# Patient Record
Sex: Female | Born: 1935 | Race: White | Hispanic: No | Smoking: Former smoker
Health system: Southern US, Community
[De-identification: ages and names within clinical notes are randomized; demographics above are authoritative.]

## PROBLEM LIST (undated history)

## (undated) DIAGNOSIS — I619 Nontraumatic intracerebral hemorrhage, unspecified: Secondary | ICD-10-CM

## (undated) DIAGNOSIS — F0390 Unspecified dementia without behavioral disturbance: Secondary | ICD-10-CM

## (undated) DIAGNOSIS — F0281 Dementia in other diseases classified elsewhere with behavioral disturbance: Secondary | ICD-10-CM

## (undated) DIAGNOSIS — I6330 Cerebral infarction due to thrombosis of unspecified cerebral artery: Secondary | ICD-10-CM

## (undated) DIAGNOSIS — I872 Venous insufficiency (chronic) (peripheral): Secondary | ICD-10-CM

## (undated) DIAGNOSIS — E785 Hyperlipidemia, unspecified: Secondary | ICD-10-CM

## (undated) DIAGNOSIS — F02818 Dementia in other diseases classified elsewhere, unspecified severity, with other behavioral disturbance: Secondary | ICD-10-CM

## (undated) DIAGNOSIS — I6529 Occlusion and stenosis of unspecified carotid artery: Secondary | ICD-10-CM

## (undated) DIAGNOSIS — K219 Gastro-esophageal reflux disease without esophagitis: Secondary | ICD-10-CM

## (undated) DIAGNOSIS — I5022 Chronic systolic (congestive) heart failure: Secondary | ICD-10-CM

## (undated) DIAGNOSIS — I10 Essential (primary) hypertension: Secondary | ICD-10-CM

## (undated) DIAGNOSIS — E039 Hypothyroidism, unspecified: Secondary | ICD-10-CM

## (undated) DIAGNOSIS — M4800 Spinal stenosis, site unspecified: Secondary | ICD-10-CM

## (undated) DIAGNOSIS — I739 Peripheral vascular disease, unspecified: Secondary | ICD-10-CM

## (undated) DIAGNOSIS — N182 Chronic kidney disease, stage 2 (mild): Secondary | ICD-10-CM

## (undated) DIAGNOSIS — I2510 Atherosclerotic heart disease of native coronary artery without angina pectoris: Secondary | ICD-10-CM

## (undated) DIAGNOSIS — C50419 Malignant neoplasm of upper-outer quadrant of unspecified female breast: Secondary | ICD-10-CM

## (undated) HISTORY — DX: Hyperlipidemia, unspecified: E78.5

## (undated) HISTORY — DX: Chronic kidney disease, stage 2 (mild): N18.2

## (undated) HISTORY — DX: Dementia in other diseases classified elsewhere, unspecified severity, with other behavioral disturbance: F02.818

## (undated) HISTORY — DX: Nontraumatic intracerebral hemorrhage, unspecified: I61.9

## (undated) HISTORY — DX: Peripheral vascular disease, unspecified: I73.9

## (undated) HISTORY — DX: Spinal stenosis, site unspecified: M48.00

## (undated) HISTORY — DX: Occlusion and stenosis of unspecified carotid artery: I65.29

## (undated) HISTORY — PX: ABDOMINAL HYSTERECTOMY: SHX81

## (undated) HISTORY — DX: Gastro-esophageal reflux disease without esophagitis: K21.9

## (undated) HISTORY — PX: EYE SURGERY: SHX253

## (undated) HISTORY — DX: Essential (primary) hypertension: I10

## (undated) HISTORY — DX: Cerebral infarction due to thrombosis of unspecified cerebral artery: I63.30

## (undated) HISTORY — PX: CHOLECYSTECTOMY: SHX55

## (undated) HISTORY — DX: Dementia in other diseases classified elsewhere with behavioral disturbance: F02.81

## (undated) HISTORY — DX: Hypothyroidism, unspecified: E03.9

## (undated) HISTORY — DX: Venous insufficiency (chronic) (peripheral): I87.2

## (undated) HISTORY — DX: Malignant neoplasm of upper-outer quadrant of unspecified female breast: C50.419

---

## 2001-11-23 HISTORY — PX: JOINT REPLACEMENT: SHX530

## 2004-03-10 ENCOUNTER — Other Ambulatory Visit: Payer: Self-pay

## 2004-08-23 ENCOUNTER — Ambulatory Visit: Payer: Self-pay | Admitting: Internal Medicine

## 2004-10-07 ENCOUNTER — Ambulatory Visit: Payer: Self-pay | Admitting: Internal Medicine

## 2004-10-23 ENCOUNTER — Ambulatory Visit: Payer: Self-pay | Admitting: Internal Medicine

## 2005-05-15 ENCOUNTER — Ambulatory Visit: Payer: Self-pay | Admitting: Gastroenterology

## 2005-12-31 ENCOUNTER — Ambulatory Visit: Payer: Self-pay | Admitting: General Practice

## 2006-02-16 ENCOUNTER — Ambulatory Visit: Payer: Self-pay | Admitting: General Practice

## 2006-02-21 HISTORY — PX: OTHER SURGICAL HISTORY: SHX169

## 2006-02-23 ENCOUNTER — Ambulatory Visit: Payer: Self-pay | Admitting: General Practice

## 2006-08-30 ENCOUNTER — Ambulatory Visit: Payer: Self-pay | Admitting: Ophthalmology

## 2006-09-06 ENCOUNTER — Ambulatory Visit: Payer: Self-pay | Admitting: Ophthalmology

## 2006-11-23 HISTORY — PX: BREAST SURGERY: SHX581

## 2007-02-03 ENCOUNTER — Ambulatory Visit: Payer: Self-pay | Admitting: Infectious Diseases

## 2007-02-15 ENCOUNTER — Ambulatory Visit: Payer: Self-pay | Admitting: Infectious Diseases

## 2007-03-08 ENCOUNTER — Ambulatory Visit: Payer: Self-pay | Admitting: General Surgery

## 2007-03-08 ENCOUNTER — Other Ambulatory Visit: Payer: Self-pay

## 2007-03-15 ENCOUNTER — Ambulatory Visit: Payer: Self-pay | Admitting: General Surgery

## 2007-03-25 ENCOUNTER — Ambulatory Visit: Payer: Self-pay | Admitting: Oncology

## 2007-04-24 ENCOUNTER — Ambulatory Visit: Payer: Self-pay | Admitting: Oncology

## 2007-05-02 ENCOUNTER — Ambulatory Visit: Payer: Self-pay | Admitting: Radiation Oncology

## 2007-05-24 ENCOUNTER — Ambulatory Visit: Payer: Self-pay | Admitting: Oncology

## 2007-05-24 ENCOUNTER — Ambulatory Visit: Payer: Self-pay | Admitting: Radiation Oncology

## 2007-06-24 ENCOUNTER — Ambulatory Visit: Payer: Self-pay | Admitting: Oncology

## 2007-08-26 ENCOUNTER — Ambulatory Visit: Payer: Self-pay | Admitting: General Surgery

## 2007-09-24 ENCOUNTER — Ambulatory Visit: Payer: Self-pay | Admitting: Oncology

## 2007-10-14 ENCOUNTER — Ambulatory Visit: Payer: Self-pay | Admitting: Oncology

## 2007-10-24 ENCOUNTER — Ambulatory Visit: Payer: Self-pay | Admitting: Oncology

## 2007-11-24 ENCOUNTER — Ambulatory Visit: Payer: Self-pay | Admitting: Radiation Oncology

## 2007-11-28 ENCOUNTER — Ambulatory Visit: Payer: Self-pay | Admitting: Internal Medicine

## 2007-11-28 ENCOUNTER — Other Ambulatory Visit: Payer: Self-pay

## 2007-12-14 ENCOUNTER — Ambulatory Visit: Payer: Self-pay | Admitting: Radiation Oncology

## 2007-12-25 ENCOUNTER — Ambulatory Visit: Payer: Self-pay | Admitting: Radiation Oncology

## 2008-01-04 ENCOUNTER — Inpatient Hospital Stay: Payer: Self-pay | Admitting: Internal Medicine

## 2008-03-14 ENCOUNTER — Ambulatory Visit: Payer: Self-pay | Admitting: General Surgery

## 2008-04-19 ENCOUNTER — Ambulatory Visit: Payer: Self-pay | Admitting: Internal Medicine

## 2008-04-20 ENCOUNTER — Ambulatory Visit: Payer: Self-pay | Admitting: Oncology

## 2008-04-23 ENCOUNTER — Ambulatory Visit: Payer: Self-pay | Admitting: Oncology

## 2008-04-23 DIAGNOSIS — M4800 Spinal stenosis, site unspecified: Secondary | ICD-10-CM

## 2008-04-23 HISTORY — DX: Spinal stenosis, site unspecified: M48.00

## 2008-04-30 ENCOUNTER — Ambulatory Visit: Payer: Self-pay | Admitting: Internal Medicine

## 2008-05-23 ENCOUNTER — Ambulatory Visit: Payer: Self-pay | Admitting: Oncology

## 2008-09-30 ENCOUNTER — Emergency Department: Payer: Self-pay | Admitting: Emergency Medicine

## 2008-10-23 ENCOUNTER — Ambulatory Visit: Payer: Self-pay | Admitting: Oncology

## 2008-10-25 ENCOUNTER — Ambulatory Visit: Payer: Self-pay | Admitting: Oncology

## 2008-11-20 ENCOUNTER — Emergency Department: Payer: Self-pay | Admitting: Emergency Medicine

## 2008-11-23 ENCOUNTER — Ambulatory Visit: Payer: Self-pay | Admitting: Oncology

## 2009-03-18 ENCOUNTER — Ambulatory Visit: Payer: Self-pay | Admitting: General Surgery

## 2009-04-03 ENCOUNTER — Encounter: Payer: Self-pay | Admitting: Internal Medicine

## 2009-04-05 ENCOUNTER — Ambulatory Visit: Payer: Self-pay | Admitting: Internal Medicine

## 2009-04-23 ENCOUNTER — Encounter: Payer: Self-pay | Admitting: Internal Medicine

## 2009-05-23 ENCOUNTER — Encounter: Payer: Self-pay | Admitting: Internal Medicine

## 2009-06-26 ENCOUNTER — Ambulatory Visit: Payer: Self-pay | Admitting: Internal Medicine

## 2009-07-03 ENCOUNTER — Ambulatory Visit: Payer: Self-pay | Admitting: Internal Medicine

## 2009-07-10 ENCOUNTER — Ambulatory Visit: Payer: Self-pay | Admitting: Internal Medicine

## 2009-10-23 ENCOUNTER — Ambulatory Visit: Payer: Self-pay | Admitting: Oncology

## 2009-11-14 ENCOUNTER — Ambulatory Visit: Payer: Self-pay | Admitting: Oncology

## 2009-11-23 ENCOUNTER — Ambulatory Visit: Payer: Self-pay | Admitting: Oncology

## 2010-03-19 ENCOUNTER — Ambulatory Visit: Payer: Self-pay | Admitting: Oncology

## 2010-11-20 ENCOUNTER — Ambulatory Visit: Payer: Self-pay | Admitting: Oncology

## 2010-12-01 ENCOUNTER — Ambulatory Visit: Payer: Medicare Other | Admitting: Oncology

## 2010-12-24 ENCOUNTER — Ambulatory Visit: Payer: Medicare Other | Admitting: Oncology

## 2011-04-07 ENCOUNTER — Ambulatory Visit: Payer: Medicare Other | Admitting: Oncology

## 2011-06-03 ENCOUNTER — Ambulatory Visit: Payer: Medicare Other | Admitting: Oncology

## 2011-06-24 ENCOUNTER — Ambulatory Visit: Payer: Medicare Other | Admitting: Oncology

## 2011-07-24 ENCOUNTER — Telehealth: Payer: Self-pay | Admitting: Internal Medicine

## 2011-07-24 NOTE — Telephone Encounter (Signed)
Please send refill of Lipitor to [facility] [address]

## 2011-07-28 ENCOUNTER — Ambulatory Visit (INDEPENDENT_AMBULATORY_CARE_PROVIDER_SITE_OTHER): Payer: Medicare Other | Admitting: Internal Medicine

## 2011-07-28 ENCOUNTER — Encounter: Payer: Self-pay | Admitting: Internal Medicine

## 2011-07-28 VITALS — BP 173/79 | HR 76 | Temp 98.5°F | Resp 18 | Wt 221.8 lb

## 2011-07-28 DIAGNOSIS — I10 Essential (primary) hypertension: Secondary | ICD-10-CM | POA: Insufficient documentation

## 2011-07-28 DIAGNOSIS — E1051 Type 1 diabetes mellitus with diabetic peripheral angiopathy without gangrene: Secondary | ICD-10-CM

## 2011-07-28 DIAGNOSIS — Z853 Personal history of malignant neoplasm of breast: Secondary | ICD-10-CM

## 2011-07-28 DIAGNOSIS — I87329 Chronic venous hypertension (idiopathic) with inflammation of unspecified lower extremity: Secondary | ICD-10-CM

## 2011-07-28 DIAGNOSIS — M48061 Spinal stenosis, lumbar region without neurogenic claudication: Secondary | ICD-10-CM | POA: Insufficient documentation

## 2011-07-28 DIAGNOSIS — E785 Hyperlipidemia, unspecified: Secondary | ICD-10-CM

## 2011-07-28 DIAGNOSIS — N183 Chronic kidney disease, stage 3 unspecified: Secondary | ICD-10-CM

## 2011-07-28 DIAGNOSIS — M545 Low back pain, unspecified: Secondary | ICD-10-CM

## 2011-07-28 DIAGNOSIS — E1151 Type 2 diabetes mellitus with diabetic peripheral angiopathy without gangrene: Secondary | ICD-10-CM

## 2011-07-28 DIAGNOSIS — Z85828 Personal history of other malignant neoplasm of skin: Secondary | ICD-10-CM

## 2011-07-28 DIAGNOSIS — L989 Disorder of the skin and subcutaneous tissue, unspecified: Secondary | ICD-10-CM

## 2011-07-28 DIAGNOSIS — E039 Hypothyroidism, unspecified: Secondary | ICD-10-CM | POA: Insufficient documentation

## 2011-07-28 DIAGNOSIS — F0150 Vascular dementia without behavioral disturbance: Secondary | ICD-10-CM

## 2011-07-28 DIAGNOSIS — E1351 Other specified diabetes mellitus with diabetic peripheral angiopathy without gangrene: Secondary | ICD-10-CM | POA: Insufficient documentation

## 2011-07-28 DIAGNOSIS — E118 Type 2 diabetes mellitus with unspecified complications: Secondary | ICD-10-CM

## 2011-07-28 MED ORDER — ATORVASTATIN CALCIUM 40 MG PO TABS: 40.0000 mg | ORAL_TABLET | Freq: Every day | ORAL | Status: DC

## 2011-07-28 NOTE — Assessment & Plan Note (Signed)
Last Aic was 7.4 Apr 23 2011.  Currently her sugars are controlled. She is up to date on eye exams, is supposed to be taking losartan  Repeat A1c is due

## 2011-07-28 NOTE — Progress Notes (Signed)
  Subjective:    Patient ID: Christine Gonzales, female    DOB: 12/18/1924, 77 y.o.   MRN: [id number]  HPI  Christine Gonzales is an 77 yo white female with DM Type 2 complicated by CKD, CAD, and PAD with vascular dementia who is here for 3 month followup.  Chief complaints today are several:  Back pain, skin lesions, legs swollen.   Her low back pain is chronic, no acute worsening, no radiation to either leg. Aggravataed by days activities.  Takes tylenol for it. Does not want meds or workup at this time.  Daughter requesting dermatologic evaluation of several skin lesions on her back and right posterior calf.  All or pruritic .  Blood sugars under control according to log brought in.  One low of 65 fasting, occurred after a night of decreased dinner intake.  Blood pressures are elevatedn (4/5) with pulse in 60's.  Did not being current medication list with her,.  Changes were made at last visit May 31 with amlodipine stopped and spironolactonestarted.  Sprionolactone and  and losartan both missing from current recall. .   Past Medical History  Diagnosis Date  . Hypertension   . Malignant neoplasm of upper-outer quadrant of female breast   . Esophageal reflux   . Peripheral vascular disease, unspecified   . Unspecified venous (peripheral) insufficiency   . Dementia in conditions classified elsewhere with behavioral disturbance   . Chronic kidney disease, stage II (mild)   . Hyperlipidemia   . Hypothyroidism   . Diabetes mellitus     Type 2  . Spinal stenosis June 2009    Lumbar, by MRI     Review of Systems  Constitutional: Negative.   HENT: Negative.   Eyes: Negative.   Respiratory: Negative.   Cardiovascular: Positive for leg swelling. Negative for chest pain.  Gastrointestinal: Negative.   Genitourinary: Negative.   Musculoskeletal: Positive for back pain.  Skin: Positive for rash.  Neurological: Negative.   Hematological: Negative.   Psychiatric/Behavioral: Positive for confusion.    BP  173/79  Pulse 76  Temp(Src) 98.5 F (36.9 C) (Oral)  Resp 18  Wt 221 lb 12 oz (100.585 kg)  SpO2 96%    Objective:   Physical Exam  Constitutional: She appears well-developed and well-nourished.  HENT:  Mouth/Throat: Oropharynx is clear and moist.  Eyes: EOM are normal. Pupils are equal, round, and reactive to light. No scleral icterus.  Neck: Normal range of motion. Neck supple. No JVD present. No thyromegaly present.  Cardiovascular: Normal rate, regular rhythm, normal heart sounds and intact distal pulses.   Pulmonary/Chest: Effort normal and breath sounds normal.  Abdominal: Soft. Bowel sounds are normal. She exhibits no mass. There is no tenderness.  Musculoskeletal: She exhibits edema.  Lymphadenopathy:    She has no cervical adenopathy.  Neurological: She is alert.  Skin: Skin is warm and dry. Rash noted. There is erythema.  Psychiatric: She has a normal mood and affect.          Assessment & Plan:

## 2011-07-28 NOTE — Telephone Encounter (Signed)
Rx has been called in  

## 2011-07-28 NOTE — Patient Instructions (Addendum)
Your blood pressure medication needs adjusting and you need fasting labs  in two weeks.  Your blood sugars are all in good range, but make sure you do not skip a meal.

## 2011-07-29 NOTE — Assessment & Plan Note (Signed)
With chronic lymphedema type changes to both LEs.  Past histroy of ulcers but one currently.  Family helps her don compression stockings as she cannot put them on herself.Marland Kitchen

## 2011-07-29 NOTE — Assessment & Plan Note (Signed)
Her TSH has not been checked in 6 months.  Reviewed propere admn of meds.  TSH ordered.

## 2011-07-29 NOTE — Assessment & Plan Note (Signed)
She has regular followup with Dr. 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 Last ABIs were earlier in the year, 0.6 worst .

## 2011-07-29 NOTE — Assessment & Plan Note (Signed)
She relies on family to manage her medications and does not recall what she is taking or when.

## 2011-07-29 NOTE — Assessment & Plan Note (Signed)
Her recent FL2 requested indicated that she was no longer taking Femara, which was started after her dx of BRCA in 2008 .  Will review with family .  Dr. Neale Burly is her oncologist.

## 2011-07-29 NOTE — Assessment & Plan Note (Signed)
She has low back pain and tailbone pain, nonradiating, which limits her ambulation to less than 100 feet.

## 2011-08-05 ENCOUNTER — Encounter: Payer: Self-pay | Admitting: Internal Medicine

## 2011-08-11 ENCOUNTER — Other Ambulatory Visit (INDEPENDENT_AMBULATORY_CARE_PROVIDER_SITE_OTHER): Payer: Medicare Other | Admitting: *Deleted

## 2011-08-11 DIAGNOSIS — E785 Hyperlipidemia, unspecified: Secondary | ICD-10-CM

## 2011-08-11 DIAGNOSIS — E039 Hypothyroidism, unspecified: Secondary | ICD-10-CM

## 2011-08-11 DIAGNOSIS — E118 Type 2 diabetes mellitus with unspecified complications: Secondary | ICD-10-CM

## 2011-08-11 LAB — COMPREHENSIVE METABOLIC PANEL
CO2: 29 mEq/L (ref 19–32)
Calcium: 9.4 mg/dL (ref 8.4–10.5)
Chloride: 108 mEq/L (ref 96–112)
Creatinine, Ser: 1.5 mg/dL — ABNORMAL HIGH (ref 0.4–1.2)
GFR: 35.11 mL/min — ABNORMAL LOW (ref >60.00)
Glucose, Bld: 126 mg/dL — ABNORMAL HIGH (ref 70–99)
Sodium: 145 mEq/L (ref 135–145)
Total Bilirubin: 0.5 mg/dL (ref 0.3–1.2)
Total Protein: 7.5 g/dL (ref 6.0–8.3)

## 2011-08-11 LAB — TSH: TSH: 1.5 u[IU]/mL (ref 0.35–5.50)

## 2011-08-11 LAB — CHOLESTEROL, TOTAL: Cholesterol: 144 mg/dL (ref 0–200)

## 2011-08-17 ENCOUNTER — Telehealth: Payer: Self-pay | Admitting: Internal Medicine

## 2011-08-17 NOTE — Telephone Encounter (Signed)
KIM SAID LAST TIME MS Rebstock WAS IN DR TULLO WANTED TO GET XRAY AND MS Piggott REFUSED DAUGHTER WANTS TO GET THIS SET UP NOW.  DAUGHTER WANTS TO GET REFILL ON RX OF VICODIN [facility] [address]

## 2011-08-18 ENCOUNTER — Other Ambulatory Visit: Payer: Self-pay | Admitting: Internal Medicine

## 2011-08-19 MED ORDER — HYDROCODONE-ACETAMINOPHEN 5-500 MG PO TABS: 1.0000 | ORAL_TABLET | Freq: Two times a day (BID) | ORAL | Status: DC | PRN

## 2011-08-26 ENCOUNTER — Other Ambulatory Visit: Payer: Self-pay | Admitting: Internal Medicine

## 2011-09-01 DIAGNOSIS — Z0279 Encounter for issue of other medical certificate: Secondary | ICD-10-CM

## 2011-09-17 ENCOUNTER — Other Ambulatory Visit: Payer: Self-pay | Admitting: Internal Medicine

## 2011-09-22 ENCOUNTER — Encounter: Payer: Medicare Other | Admitting: Nurse Practitioner

## 2011-09-22 ENCOUNTER — Encounter: Payer: Medicare Other | Admitting: Cardiothoracic Surgery

## 2011-09-23 ENCOUNTER — Other Ambulatory Visit: Payer: Self-pay | Admitting: Internal Medicine

## 2011-09-24 ENCOUNTER — Encounter: Payer: Medicare Other | Admitting: Nurse Practitioner

## 2011-09-30 ENCOUNTER — Ambulatory Visit (INDEPENDENT_AMBULATORY_CARE_PROVIDER_SITE_OTHER): Payer: Medicare Other | Admitting: Internal Medicine

## 2011-09-30 DIAGNOSIS — Z111 Encounter for screening for respiratory tuberculosis: Secondary | ICD-10-CM

## 2011-10-01 ENCOUNTER — Encounter: Payer: Medicare Other | Admitting: Cardiothoracic Surgery

## 2011-10-02 ENCOUNTER — Ambulatory Visit: Payer: Medicare Other | Admitting: *Deleted

## 2011-10-08 ENCOUNTER — Telehealth: Payer: Self-pay | Admitting: *Deleted

## 2011-10-08 NOTE — Telephone Encounter (Signed)
[facility] [place] called - Pt has had loose stools x 1 wk. Daughter informed them not to be worried b/c pt has loose stools w/stress. Pt has had large amounts of mucus in the stools today and decreased appetite. Temp 97.9, BP 108/54, P 53. Spoke w/dr Darrick Huntsman - Lab order faxed over for stool culture, magnesium level, cbc and cmet. Pt has PRN order for immodium which they will start now. I advised she increase intake of clear liquids and call with any change in symptoms. Lab will fax results as soon as avail.   Faxed order to [phone]

## 2011-10-22 ENCOUNTER — Telehealth: Payer: Self-pay | Admitting: *Deleted

## 2011-10-22 NOTE — Telephone Encounter (Signed)
Christine Gonzales labs came back and she has a new iron deficiency anemia. Her hgb is down  To 9.8  I would like to send the [facility] an order for several more labs  1) hemoccult her stools.   2) EPO level 3) retic count  And I would like to start her on iron sulfate 325 mg onen tablet twice daily with meals.  #60 3 refills.

## 2011-10-22 NOTE — Telephone Encounter (Signed)
[facility] called - Pt was "not acting right", and unable to fed herself. They checked her cbg - it was 532. Pt has no sliding scale orders. Faxed order per Dr Darrick Huntsman for novolog 100u/mL 20 units now, also limit sweets and increase fluids. They advised me that u/a was in process also.

## 2011-10-23 ENCOUNTER — Ambulatory Visit: Payer: Medicare Other | Admitting: Internal Medicine

## 2011-10-23 ENCOUNTER — Observation Stay: Payer: Medicare Other | Admitting: Internal Medicine

## 2011-10-23 DIAGNOSIS — D649 Anemia, unspecified: Secondary | ICD-10-CM

## 2011-10-23 DIAGNOSIS — F05 Delirium due to known physiological condition: Secondary | ICD-10-CM

## 2011-10-23 DIAGNOSIS — E15 Nondiabetic hypoglycemic coma: Secondary | ICD-10-CM

## 2011-10-23 DIAGNOSIS — N390 Urinary tract infection, site not specified: Secondary | ICD-10-CM

## 2011-10-23 NOTE — Telephone Encounter (Signed)
The order is ready to send to [facility].

## 2011-10-24 ENCOUNTER — Ambulatory Visit: Payer: Medicare Other | Admitting: Internal Medicine

## 2011-10-28 ENCOUNTER — Encounter: Payer: Self-pay | Admitting: Internal Medicine

## 2011-11-03 ENCOUNTER — Telehealth: Payer: Self-pay | Admitting: *Deleted

## 2011-11-03 NOTE — Telephone Encounter (Signed)
Thsi should be a simple task , but because of EPIC it is not.  Please send them a note to resume home health.

## 2011-11-03 NOTE — Telephone Encounter (Signed)
Patient was recently in the hospital, but is now back at home and [facility] needs a note to resume home health. This can be faxed to [facility].

## 2011-11-11 NOTE — Telephone Encounter (Signed)
Left message for verbal order. 

## 2011-11-13 ENCOUNTER — Encounter: Payer: Self-pay | Admitting: *Deleted

## 2011-11-13 ENCOUNTER — Encounter: Payer: Self-pay | Admitting: Internal Medicine

## 2011-11-18 ENCOUNTER — Encounter: Payer: Self-pay | Admitting: Internal Medicine

## 2011-11-23 ENCOUNTER — Telehealth: Payer: Self-pay | Admitting: *Deleted

## 2011-11-23 DIAGNOSIS — M25559 Pain in unspecified hip: Secondary | ICD-10-CM

## 2011-11-23 MED ORDER — HYDROCODONE-ACETAMINOPHEN 5-500 MG PO TABS: 1.0000 | ORAL_TABLET | Freq: Four times a day (QID) | ORAL | Status: DC | PRN

## 2011-11-23 NOTE — Telephone Encounter (Signed)
Send her to Er.  I cannot evaluate her without an x ray

## 2011-11-23 NOTE — Telephone Encounter (Signed)
The [facility] has faxed a note stating that pt is complaining of severe hip and leg pain, asking what to do.  Note is in your "to Sign" folder, on your desk.

## 2011-11-23 NOTE — Telephone Encounter (Signed)
Spoke with Christine Gonzales at [employer]  She said pt has had this pain for awhile.  She takes 2 tylenol 325 mg's daily and one or two vicodin 5/500 mg daily.  They can have someone come out to x-ray the patient or they are asking if you want to increase her meds.  If so, they will need a written order faxed to them.

## 2011-11-24 DIAGNOSIS — I619 Nontraumatic intracerebral hemorrhage, unspecified: Secondary | ICD-10-CM

## 2011-11-24 DIAGNOSIS — I6330 Cerebral infarction due to thrombosis of unspecified cerebral artery: Secondary | ICD-10-CM

## 2011-11-24 HISTORY — DX: Cerebral infarction due to thrombosis of unspecified cerebral artery: I63.30

## 2011-11-24 HISTORY — DX: Nontraumatic intracerebral hemorrhage, unspecified: I61.9

## 2011-11-25 NOTE — Telephone Encounter (Signed)
rs for increased vicodin dosing and e x rays of both hips was faxed to the [facility] late Monday evening.

## 2011-11-30 ENCOUNTER — Telehealth: Payer: Self-pay | Admitting: *Deleted

## 2011-11-30 DIAGNOSIS — F329 Major depressive disorder, single episode, unspecified: Secondary | ICD-10-CM

## 2011-11-30 NOTE — Telephone Encounter (Signed)
Daughter called, she says that she does not want her mom to go to the ER. She says that she feels that will be really traumatic for her and she says that her mom just talks crazy sometimes. She is asking if she can have a counselor set up to come out and talk to her mom.

## 2011-11-30 NOTE — Telephone Encounter (Signed)
See if [facility] has a home healrh psyh RN who can make a house call /  I am strongly recommednig that she see a psychiatrist,  Will refer to Dr Jeanie Sewer if family agrees to take her

## 2011-11-30 NOTE — Telephone Encounter (Signed)
Fax from the [facility] states that pt is very upset and crying, she told her daughter she has done everything she knows to do except suicide.  Daughter is concerned.  Resident is known to be confused.  Please advise, fax is in your red folder.

## 2011-11-30 NOTE — Telephone Encounter (Signed)
Order in [facility] for psychiatry referral

## 2011-11-30 NOTE — Telephone Encounter (Signed)
Pt daughter tracy woods called wanted Morrie Sheldon to call ASAP  [phone]

## 2011-11-30 NOTE — Telephone Encounter (Signed)
Per Dr. Darrick Huntsman, advised Misty Stanley at the [facility] that pt needs to go to ER for involuntary commitment for suicide ideation.  Order faxed to the [facility] as well.

## 2011-11-30 NOTE — Telephone Encounter (Signed)
Bonita Quin with the [facility] notified that we will do referral to psychiatrist, DR. Williford. Daughter has been notified. [facility] and they do not have any psych RN that can make house calls. Family is okay with her just seeing Dr. Jeanie Sewer. Also the [facility] will need a copy of the referral faxed to them for their records.

## 2011-11-30 NOTE — Telephone Encounter (Signed)
Please call French Ana back  [phone]

## 2011-11-30 NOTE — Telephone Encounter (Signed)
Order printed and faxed to the [facility]. Family will wait to hear from [facility] to get this set up.

## 2011-12-01 ENCOUNTER — Telehealth: Payer: Self-pay | Admitting: Internal Medicine

## 2011-12-01 NOTE — Telephone Encounter (Signed)
Advised pt's daughter.  She said pt was to see Dr. Latanya Maudlin this morning.  She asked that I cancel pt's appt with Dr. Jeanie Sewer, I called that office and left a message on voice mail to cancel appt.

## 2011-12-01 NOTE — Telephone Encounter (Signed)
I have set Christine Gonzales up with Dr. Jeanie Sewer, as it was documented in my referral wq.  When I called daughter to advise her of the appointment information she wanted to know what the difference was in Dr. York Spaniel who comes to the [facility], and Dr. Jeanie Sewer.  Daughter wants to make sure her mom gets the help she needs but also doesn't want her insurance to deny payment.  She said that if she needed to come from [place] and physically take her mom somewhere she would do that, but if Dr. York Spaniel who did an inital  assessment on her mother on 11/06/11 is the same specialist as Dr. Jeanie Sewer she would like for Dr. Latanya Maudlin to see her mother at the [facility].  Please advise.

## 2011-12-01 NOTE — Telephone Encounter (Signed)
No she can see Dr. York Spaniel,,  She is probably a psychologist,  not a psychiatrist.. I was not aware that the [facility] had someone there, but we can certainly start with her.

## 2011-12-02 ENCOUNTER — Telehealth: Payer: Self-pay | Admitting: *Deleted

## 2011-12-02 ENCOUNTER — Encounter: Payer: Self-pay | Admitting: Internal Medicine

## 2011-12-02 NOTE — Telephone Encounter (Signed)
Christine Gonzales at the [name] called to report that med tech has noticed a few spots of blood in pt's underwear and they are asking if you want to treat her with anything or see her in the office.

## 2011-12-02 NOTE — Telephone Encounter (Signed)
She will need to be scheduled for a pelvic ,

## 2011-12-03 NOTE — Telephone Encounter (Signed)
Left message on [facility] voice mail, this morning, advising that pt needs to be seen.

## 2011-12-07 NOTE — Telephone Encounter (Signed)
Pt has appt on 11/1611.

## 2011-12-09 ENCOUNTER — Ambulatory Visit (INDEPENDENT_AMBULATORY_CARE_PROVIDER_SITE_OTHER): Payer: Medicare Other | Admitting: Internal Medicine

## 2011-12-09 ENCOUNTER — Encounter: Payer: Self-pay | Admitting: Internal Medicine

## 2011-12-09 VITALS — BP 140/60 | HR 82 | Temp 98.3°F | Wt 201.0 lb

## 2011-12-09 DIAGNOSIS — I70209 Unspecified atherosclerosis of native arteries of extremities, unspecified extremity: Secondary | ICD-10-CM

## 2011-12-09 DIAGNOSIS — M25559 Pain in unspecified hip: Secondary | ICD-10-CM

## 2011-12-09 DIAGNOSIS — I798 Other disorders of arteries, arterioles and capillaries in diseases classified elsewhere: Secondary | ICD-10-CM

## 2011-12-09 DIAGNOSIS — J40 Bronchitis, not specified as acute or chronic: Secondary | ICD-10-CM

## 2011-12-09 DIAGNOSIS — E1351 Other specified diabetes mellitus with diabetic peripheral angiopathy without gangrene: Secondary | ICD-10-CM

## 2011-12-09 DIAGNOSIS — K625 Hemorrhage of anus and rectum: Secondary | ICD-10-CM

## 2011-12-09 DIAGNOSIS — E1151 Type 2 diabetes mellitus with diabetic peripheral angiopathy without gangrene: Secondary | ICD-10-CM

## 2011-12-09 DIAGNOSIS — F0150 Vascular dementia without behavioral disturbance: Secondary | ICD-10-CM

## 2011-12-09 MED ORDER — HYDROCODONE-ACETAMINOPHEN 5-500 MG PO TABS: 1.0000 | ORAL_TABLET | Freq: Four times a day (QID) | ORAL | Status: DC | PRN

## 2011-12-09 MED ORDER — DOXYCYCLINE HYCLATE 100 MG PO TABS: 100.0000 mg | ORAL_TABLET | Freq: Two times a day (BID) | ORAL | Status: AC

## 2011-12-09 MED ORDER — PREDNISONE (PAK) 10 MG PO TABS: ORAL_TABLET | ORAL | Status: AC

## 2011-12-09 NOTE — Progress Notes (Signed)
Subjective:    Patient ID: Christine Gonzales, female    DOB: 1924/12/01, 78 y.o.   MRN: [id number]  HPI  [name] is an 78 year old white female history of diabetes mellitus with chronic kidney disease neuropathy and peripheral vascular disease as well as vascular dementia and history of breast cancer who recently moved into the [facility] for long-term care. The appointment was made today because of reports from the [facility] of blood stained underwear. Patient states that she has seen occasional blood in her underwear and when she wipes but does not appear concerned about it she does not feel well today secondary to a cough which has been present for several days. She denies chest pain shortness of breath wheezing abdominal pain and vaginal bleeding she is not sure where the blood is coming from and is not report any trouble with constipation.  Past Medical History  Diagnosis Date  . Hypertension   . Malignant neoplasm of upper-outer quadrant of female breast   . Esophageal reflux   . Peripheral vascular disease, unspecified   . Unspecified venous (peripheral) insufficiency   . Dementia in conditions classified elsewhere with behavioral disturbance   . Chronic kidney disease, stage II (mild)   . Hyperlipidemia   . Hypothyroidism   . Diabetes mellitus     Type 2  . Spinal stenosis June 2009    Lumbar, by MRI   Current Outpatient Prescriptions on File Prior to Visit  Medication Sig Dispense Refill  . aspirin 81 MG tablet Take 81 mg by mouth daily.        Marland Kitchen atorvastatin (LIPITOR) 40 MG tablet Take 1 tablet (40 mg total) by mouth daily.  30 tablet  3  . cholecalciferol (VITAMIN D) 1000 UNITS tablet Take 1,000 Units by mouth daily.        Marland Kitchen donepezil (ARICEPT) 5 MG tablet Take 5 mg by mouth at bedtime.        . furosemide (LASIX) 20 MG tablet Take 20 mg by mouth daily.       Marland Kitchen letrozole (FEMARA) 2.5 MG tablet Take 2.5 mg by mouth daily.        Marland Kitchen levothyroxine (SYNTHROID, LEVOTHROID) 125 MCG  tablet TAKE ONE TABLET BY MOUTH DAILY  90 tablet  3  . memantine (NAMENDA) 10 MG tablet Take 10 mg by mouth daily.       . metoprolol (LOPRESSOR) 50 MG tablet TAKE ONE TABLET BY MOUTH EVERY 12 HOURS  180 tablet  3  . NOVOLOG MIX 70/30 (70-30) 100 UNIT/ML injection INJECT 20 UNITS BEFORE BREAKFAST AND 8 UNITS WITH EVENING MEALS  10 mL  PRN  . omeprazole (PRILOSEC) 20 MG capsule Take 20 mg by mouth daily.        Marland Kitchen RELION INSULIN SYRINGE 1ML/31G 31G X 5/16" 1 ML MISC USE AS DIRECTED TWICE DAILY  60 each  11  . amLODipine (NORVASC) 5 MG tablet Take 5 mg by mouth daily.        Marland Kitchen glyBURIDE (DIABETA) 5 MG tablet Take 5 mg by mouth 2 (two) times daily.          Review of Systems  Constitutional: Negative for fever, chills and unexpected weight change.  HENT: Negative for hearing loss, ear pain, nosebleeds, congestion, sore throat, facial swelling, rhinorrhea, sneezing, mouth sores, trouble swallowing, neck pain, neck stiffness, voice change, postnasal drip, sinus pressure, tinnitus and ear discharge.   Eyes: Negative for pain, discharge, redness and visual disturbance.  Respiratory: Positive for  cough. Negative for chest tightness, shortness of breath, wheezing and stridor.   Cardiovascular: Negative for chest pain, palpitations and leg swelling.  Genitourinary: Positive for vaginal bleeding.  Musculoskeletal: Negative for myalgias and arthralgias.  Skin: Negative for color change and rash.  Neurological: Negative for dizziness, weakness, light-headedness and headaches.  Hematological: Negative for adenopathy.       Objective:   Physical Exam  Constitutional: She is oriented to person, place, and time. She appears well-developed and well-nourished.  HENT:  Mouth/Throat: Oropharynx is clear and moist.  Eyes: EOM are normal. Pupils are equal, round, and reactive to light. No scleral icterus.  Neck: Normal range of motion. Neck supple. No JVD present. No thyromegaly present.  Cardiovascular:  Normal rate, regular rhythm, normal heart sounds and intact distal pulses.   Pulmonary/Chest: Effort normal and breath sounds normal.  Abdominal: Soft. Bowel sounds are normal. She exhibits no mass. There is no tenderness.  Genitourinary: Vagina normal and uterus normal. No vaginal discharge found.  Musculoskeletal: Normal range of motion. She exhibits no edema.  Lymphadenopathy:    She has no cervical adenopathy.  Neurological: She is alert and oriented to person, place, and time.  Skin: Skin is warm and dry.  Psychiatric: She has a normal mood and affect.      Assessment & Plan:   Peripheral vascular disease due to secondary diabetes mellitus She has combined atherosclerotic disease of the peripheral arterial circulation as well as venous insufficiency. Her vascular disease it is non-operable due to multiple comorbidities and we will continue to balance management of venous insufficiency with low strength compression stockings and when necessary use of Lasix.  Bright red blood per rectum Pelvic and rectal exam were performed today to rule out gynecologic etiology of the blood that has been noticed in her underwear. Her pelvic exam is completely normal except for an exoration on her left labium, which appears to have been scratched.  Will prescribe nystatin empirically. She does have some internal and external hemorrhoids I suspect this is the cause of her bleeding. We'll prescribe Anusol HC and Colace to manage any constipation it may be contributing to bleeding internal or external hemorrhoids. No further workup given her age and comorbidities.  Arteriosclerotic dementia She has recently been moved to assisted living at the [facility] after the death of her husband who was her primary caregiver assisted by 2 daughters. She is adjusting as well as can be expected however has made several comments suggesting she is mildly depressed. The [name] does have a psychiatric nurse practitioner who is scheduled  to evaluate patient and make suggestions regarding medical management.  Bronchitis Given her comorbidities of diabetes and dementia will treat her current bronchitis with doxycycline and cough suppressants. She is not wheezing on exam and therefore does not require steroids or bronchodilators.    Updated Medication List Outpatient Encounter Prescriptions as of 12/09/2011  Medication Sig Dispense Refill  . aspirin 81 MG tablet Take 81 mg by mouth daily.        Marland Kitchen atorvastatin (LIPITOR) 40 MG tablet Take 1 tablet (40 mg total) by mouth daily.  30 tablet  3  . cholecalciferol (VITAMIN D) 1000 UNITS tablet Take 1,000 Units by mouth daily.        Marland Kitchen donepezil (ARICEPT) 5 MG tablet Take 5 mg by mouth at bedtime.        . furosemide (LASIX) 20 MG tablet Take 20 mg by mouth daily.       Marland Kitchen glipiZIDE (  GLUCOTROL) 5 MG tablet Take 5 mg by mouth 2 (two) times daily before a meal.      . HYDROcodone-acetaminophen (VICODIN) 5-500 MG per tablet Take 1 tablet by mouth every 6 (six) hours as needed for pain (or for cough).  120 tablet  1  . letrozole (FEMARA) 2.5 MG tablet Take 2.5 mg by mouth daily.        Marland Kitchen levothyroxine (SYNTHROID, LEVOTHROID) 125 MCG tablet TAKE ONE TABLET BY MOUTH DAILY  90 tablet  3  . losartan (COZAAR) 100 MG tablet Take 100 mg by mouth daily.      . memantine (NAMENDA) 10 MG tablet Take 10 mg by mouth daily.       . metoprolol (LOPRESSOR) 50 MG tablet TAKE ONE TABLET BY MOUTH EVERY 12 HOURS  180 tablet  3  . NOVOLOG MIX 70/30 (70-30) 100 UNIT/ML injection INJECT 20 UNITS BEFORE BREAKFAST AND 8 UNITS WITH EVENING MEALS  10 mL  PRN  . omeprazole (PRILOSEC) 20 MG capsule Take 20 mg by mouth daily.        Marland Kitchen RELION INSULIN SYRINGE 1ML/31G 31G X 5/16" 1 ML MISC USE AS DIRECTED TWICE DAILY  60 each  11  . DISCONTD: HYDROcodone-acetaminophen (VICODIN) 5-500 MG per tablet Take 1 tablet by mouth every 6 (six) hours as needed for pain.  120 tablet  1  . amLODipine (NORVASC) 5 MG tablet Take 5 mg  by mouth daily.        Marland Kitchen doxycycline (VIBRA-TABS) 100 MG tablet Take 1 tablet (100 mg total) by mouth 2 (two) times daily.  14 tablet  0  . glyBURIDE (DIABETA) 5 MG tablet Take 5 mg by mouth 2 (two) times daily.        . predniSONE (STERAPRED UNI-PAK) 10 MG tablet 6 tablets on Day 1 , then reduce by 1 tablet daily until gone  21 tablet  0

## 2011-12-11 ENCOUNTER — Encounter: Payer: Self-pay | Admitting: Internal Medicine

## 2011-12-11 DIAGNOSIS — J40 Bronchitis, not specified as acute or chronic: Secondary | ICD-10-CM | POA: Insufficient documentation

## 2011-12-11 DIAGNOSIS — K625 Hemorrhage of anus and rectum: Secondary | ICD-10-CM | POA: Insufficient documentation

## 2011-12-11 NOTE — Assessment & Plan Note (Signed)
Pelvic and rectal exam were performed today to rule out gynecologic etiology of the blood that has been noticed in her underwear. Her pelvic exam is completely normal except for vaginal atrophy. She does have some internal and external hemorrhoids I suspect this is the cause of her bleeding. We'll prescribe Anusol HC and Colace to manage any constipation it may be contributing to bleeding internal or external hemorrhoids. No further workup given her age and comorbidities.

## 2011-12-11 NOTE — Assessment & Plan Note (Signed)
She has recently been moved to assisted living at the [facility] after the death of her husband who was her primary caregiver assisted by 2 daughters. She is adjusting as well as can be expected however has made several comments suggesting she is mildly depressed. The [name] does have a psychiatric nurse practitioner who is scheduled to evaluate patient and make suggestions regarding medical management.

## 2011-12-11 NOTE — Assessment & Plan Note (Addendum)
I have reviewed the CBGs that have been documented by the staff at the [facility]. Most of her blood sugars appear to be in range. No changes are made today to her insulin regimen.  She has chronic renal insufficiency which is stable and the hemoglobin A1c was 6.3 in mid November 2012.

## 2011-12-11 NOTE — Assessment & Plan Note (Signed)
She has combined atherosclerotic disease of the peripheral arterial circulation as well as venous insufficiency. Her vascular disease it is non-operable due to multiple comorbidities and we will continue to balance management of venous insufficiency with low strength compression stockings and when necessary use of Lasix.

## 2011-12-11 NOTE — Assessment & Plan Note (Signed)
Given her comorbidities of diabetes and dementia will treat her current bronchitis with doxycycline and cough suppressants. She is not wheezing on exam and therefore does not require steroids or bronchodilators.

## 2012-02-23 ENCOUNTER — Telehealth: Payer: Self-pay | Admitting: Internal Medicine

## 2012-02-23 ENCOUNTER — Inpatient Hospital Stay: Payer: Self-pay | Admitting: Internal Medicine

## 2012-02-23 LAB — COMPREHENSIVE METABOLIC PANEL
Albumin: 3.5 g/dL (ref 3.4–5.0)
BUN: 22 mg/dL — ABNORMAL HIGH (ref 7–18)
Calcium, Total: 8.6 mg/dL (ref 8.5–10.1)
Co2: 24 mmol/L (ref 21–32)
Creatinine: 1.44 mg/dL — ABNORMAL HIGH (ref 0.60–1.30)
EGFR (African American): 45 — ABNORMAL LOW
EGFR (Non-African Amer.): 37 — ABNORMAL LOW
Glucose: 134 mg/dL — ABNORMAL HIGH (ref 65–99)
Osmolality: 287 (ref 275–301)
Potassium: 3.9 mmol/L (ref 3.5–5.1)
SGPT (ALT): 22 U/L
Sodium: 141 mmol/L (ref 136–145)

## 2012-02-23 LAB — PRO B NATRIURETIC PEPTIDE: B-Type Natriuretic Peptide: 62931 pg/mL — ABNORMAL HIGH (ref 0–450)

## 2012-02-23 LAB — CBC
HCT: 29.3 % — ABNORMAL LOW (ref 35.0–47.0)
MCH: 20 pg — ABNORMAL LOW (ref 26.0–34.0)
RDW: 19.2 % — ABNORMAL HIGH (ref 11.5–14.5)
WBC: 11.3 10*3/uL — ABNORMAL HIGH (ref 3.6–11.0)

## 2012-02-23 LAB — URINALYSIS, COMPLETE
Glucose,UR: NEGATIVE mg/dL (ref 0–75)
Ketone: NEGATIVE
Nitrite: NEGATIVE
Ph: 5 (ref 4.5–8.0)
Protein: NEGATIVE
Specific Gravity: 1.008 (ref 1.003–1.030)
WBC UR: 6 /HPF (ref 0–5)

## 2012-02-23 LAB — TSH: Thyroid Stimulating Horm: 0.74 u[IU]/mL

## 2012-02-23 LAB — PROTIME-INR: Prothrombin Time: 17.2 secs — ABNORMAL HIGH (ref 11.5–14.7)

## 2012-02-23 LAB — CK TOTAL AND CKMB (NOT AT ARMC)
CK, Total: 74 U/L (ref 21–215)
CK-MB: 3.3 ng/mL (ref 0.5–3.6)

## 2012-02-23 LAB — HEMOGLOBIN A1C: Hemoglobin A1C: 5.8 % (ref 4.2–6.3)

## 2012-02-23 NOTE — Telephone Encounter (Signed)
[facility] Triage Call Report Triage Record Num: [id number] Operator: Caswell Corwin Patient Name: [facility] Call Date & Time: 02/23/2012 4:05:52PM Patient Phone: [phone] PCP: Patient Gender: Female PCP Fax : Patient DOB: 03-25-1925 Practice Name: [facility] Reason for Call: Caller: [facility] [employer]. /; PCP: Duncan Dull; CB#: [phone]; Call that pt has been having weakness, dizziness, irregular pulse and chest congestion and is being transported to [facility]. No Access to [facility]. Protocol(s) Used: PCP Calls, No Triage (Adult) Recommended Outcome per Protocol: Call Provider Immediately Reason for Outcome: Notification of hospital admission Care Advice: ~ 02/23/2012 4:12:13PM Page 1 of 1 [email or web address] [facility] Triage Call Report Triage Record Num: [id number] Operator: Caswell Corwin Patient Name: [facility] Call Date & Time: 02/23/2012 4:05:52PM Patient Phone: [phone] PCP: Patient Gender: Female PCP Fax : Patient DOB: 1925/07/07 Practice Name: [facility] Reason for Call: Caller: Jamie at the [facility] [place] [facility] [employer]. /; PCP: Duncan Dull; CB#: [phone]; Call that pt has been having weakness, dizziness, irregular pulse and chest congestion and is being transported to [facility]. No Access to [facility]. Protocol(s) Used: Office Note Recommended Outcome per Protocol: Information Noted and Sent to Office Reason for Outcome: Caller information to office Care Advice: ~ 02/23/2012 4:12:17PM Page 1 of 1 [email or web address]

## 2012-02-24 ENCOUNTER — Encounter (HOSPITAL_COMMUNITY): Payer: Self-pay | Admitting: Physician Assistant

## 2012-02-24 ENCOUNTER — Inpatient Hospital Stay (HOSPITAL_COMMUNITY)
Admission: AD | Admit: 2012-02-24 | Discharge: 2012-02-29 | DRG: 280 | Disposition: A | Discharge status: Nursing Home (Includes ICF) | Payer: Medicare Other | Source: Ambulatory Visit | Attending: Cardiology | Admitting: Cardiology

## 2012-02-24 ENCOUNTER — Inpatient Hospital Stay (HOSPITAL_COMMUNITY): Admission: EM | Admit: 2012-02-24 | Payer: Self-pay | Source: Other Acute Inpatient Hospital | Admitting: Cardiology

## 2012-02-24 DIAGNOSIS — R05 Cough: Secondary | ICD-10-CM

## 2012-02-24 DIAGNOSIS — Z87891 Personal history of nicotine dependence: Secondary | ICD-10-CM

## 2012-02-24 DIAGNOSIS — I672 Cerebral atherosclerosis: Secondary | ICD-10-CM | POA: Diagnosis present

## 2012-02-24 DIAGNOSIS — I498 Other specified cardiac arrhythmias: Secondary | ICD-10-CM | POA: Diagnosis not present

## 2012-02-24 DIAGNOSIS — I6529 Occlusion and stenosis of unspecified carotid artery: Secondary | ICD-10-CM | POA: Diagnosis present

## 2012-02-24 DIAGNOSIS — I214 Non-ST elevation (NSTEMI) myocardial infarction: Secondary | ICD-10-CM

## 2012-02-24 DIAGNOSIS — Z96659 Presence of unspecified artificial knee joint: Secondary | ICD-10-CM

## 2012-02-24 DIAGNOSIS — I2510 Atherosclerotic heart disease of native coronary artery without angina pectoris: Secondary | ICD-10-CM | POA: Diagnosis present

## 2012-02-24 DIAGNOSIS — Z9849 Cataract extraction status, unspecified eye: Secondary | ICD-10-CM

## 2012-02-24 DIAGNOSIS — E876 Hypokalemia: Secondary | ICD-10-CM

## 2012-02-24 DIAGNOSIS — I129 Hypertensive chronic kidney disease with stage 1 through stage 4 chronic kidney disease, or unspecified chronic kidney disease: Secondary | ICD-10-CM | POA: Diagnosis present

## 2012-02-24 DIAGNOSIS — Z66 Do not resuscitate: Secondary | ICD-10-CM | POA: Diagnosis present

## 2012-02-24 DIAGNOSIS — I739 Peripheral vascular disease, unspecified: Secondary | ICD-10-CM | POA: Diagnosis present

## 2012-02-24 DIAGNOSIS — I70209 Unspecified atherosclerosis of native arteries of extremities, unspecified extremity: Secondary | ICD-10-CM | POA: Diagnosis present

## 2012-02-24 DIAGNOSIS — I5021 Acute systolic (congestive) heart failure: Secondary | ICD-10-CM | POA: Diagnosis present

## 2012-02-24 DIAGNOSIS — Z794 Long term (current) use of insulin: Secondary | ICD-10-CM

## 2012-02-24 DIAGNOSIS — Z6841 Body Mass Index (BMI) 40.0 and over, adult: Secondary | ICD-10-CM

## 2012-02-24 DIAGNOSIS — E1151 Type 2 diabetes mellitus with diabetic peripheral angiopathy without gangrene: Secondary | ICD-10-CM

## 2012-02-24 DIAGNOSIS — R5381 Other malaise: Secondary | ICD-10-CM

## 2012-02-24 DIAGNOSIS — R748 Abnormal levels of other serum enzymes: Secondary | ICD-10-CM

## 2012-02-24 DIAGNOSIS — F0390 Unspecified dementia without behavioral disturbance: Secondary | ICD-10-CM | POA: Diagnosis present

## 2012-02-24 DIAGNOSIS — D649 Anemia, unspecified: Secondary | ICD-10-CM | POA: Diagnosis present

## 2012-02-24 DIAGNOSIS — E785 Hyperlipidemia, unspecified: Secondary | ICD-10-CM | POA: Diagnosis present

## 2012-02-24 DIAGNOSIS — I10 Essential (primary) hypertension: Secondary | ICD-10-CM | POA: Diagnosis present

## 2012-02-24 DIAGNOSIS — F0150 Vascular dementia without behavioral disturbance: Secondary | ICD-10-CM | POA: Diagnosis present

## 2012-02-24 DIAGNOSIS — Z7982 Long term (current) use of aspirin: Secondary | ICD-10-CM

## 2012-02-24 DIAGNOSIS — Z7902 Long term (current) use of antithrombotics/antiplatelets: Secondary | ICD-10-CM

## 2012-02-24 DIAGNOSIS — Z853 Personal history of malignant neoplasm of breast: Secondary | ICD-10-CM

## 2012-02-24 DIAGNOSIS — M25559 Pain in unspecified hip: Secondary | ICD-10-CM

## 2012-02-24 DIAGNOSIS — I2589 Other forms of chronic ischemic heart disease: Secondary | ICD-10-CM | POA: Diagnosis present

## 2012-02-24 DIAGNOSIS — E119 Type 2 diabetes mellitus without complications: Secondary | ICD-10-CM | POA: Diagnosis present

## 2012-02-24 DIAGNOSIS — Z85828 Personal history of other malignant neoplasm of skin: Secondary | ICD-10-CM

## 2012-02-24 DIAGNOSIS — E039 Hypothyroidism, unspecified: Secondary | ICD-10-CM | POA: Diagnosis present

## 2012-02-24 DIAGNOSIS — M48061 Spinal stenosis, lumbar region without neurogenic claudication: Secondary | ICD-10-CM | POA: Diagnosis present

## 2012-02-24 DIAGNOSIS — I658 Occlusion and stenosis of other precerebral arteries: Secondary | ICD-10-CM | POA: Diagnosis present

## 2012-02-24 DIAGNOSIS — Z79899 Other long term (current) drug therapy: Secondary | ICD-10-CM

## 2012-02-24 DIAGNOSIS — R5383 Other fatigue: Secondary | ICD-10-CM

## 2012-02-24 DIAGNOSIS — N183 Chronic kidney disease, stage 3 unspecified: Secondary | ICD-10-CM | POA: Diagnosis present

## 2012-02-24 DIAGNOSIS — I509 Heart failure, unspecified: Secondary | ICD-10-CM | POA: Diagnosis present

## 2012-02-24 HISTORY — DX: Chronic systolic (congestive) heart failure: I50.22

## 2012-02-24 HISTORY — DX: Atherosclerotic heart disease of native coronary artery without angina pectoris: I25.10

## 2012-02-24 HISTORY — DX: Unspecified dementia, unspecified severity, without behavioral disturbance, psychotic disturbance, mood disturbance, and anxiety: F03.90

## 2012-02-24 LAB — CK TOTAL AND CKMB (NOT AT ARMC)
CK, Total: 54 U/L (ref 21–215)
CK-MB: 3 ng/mL (ref 0.5–3.6)
CK-MB: 2.7 ng/mL (ref 0.5–3.6)

## 2012-02-24 LAB — CBC WITH DIFFERENTIAL/PLATELET
Basophil %: 0.9 %
Eosinophil #: 0.2 10*3/uL (ref 0.0–0.7)
Lymphocyte %: 13.1 %
MCV: 66 fL — ABNORMAL LOW (ref 80–100)
Monocyte #: 1.4 10*3/uL — ABNORMAL HIGH (ref 0.0–0.7)
Monocyte %: 12.8 %
WBC: 10.6 10*3/uL (ref 3.6–11.0)

## 2012-02-24 LAB — TROPONIN I
Troponin-I: 1.9 ng/mL — ABNORMAL HIGH
Troponin-I: 2.1 ng/mL — ABNORMAL HIGH

## 2012-02-24 LAB — BASIC METABOLIC PANEL
Chloride: 104 mmol/L (ref 98–107)
Co2: 32 mmol/L (ref 21–32)
EGFR (Non-African Amer.): 38 — ABNORMAL LOW
Glucose: 108 mg/dL — ABNORMAL HIGH (ref 65–99)

## 2012-02-24 LAB — APTT: Activated PTT: >160 secs (ref 23.6–35.9)

## 2012-02-24 LAB — MRSA PCR SCREENING: MRSA by PCR: NEGATIVE

## 2012-02-24 MED ORDER — ONDANSETRON HCL 4 MG/2ML IJ SOLN: 4.0000 mg | Freq: Four times a day (QID) | INTRAMUSCULAR | Status: DC | PRN

## 2012-02-24 MED ORDER — GLIPIZIDE 5 MG PO TABS
5.0000 mg | ORAL_TABLET | Freq: Two times a day (BID) | ORAL | Status: DC
  Administered 2012-02-25: 5 mg via ORAL
  Filled 2012-02-24 (×3): qty 1

## 2012-02-24 MED ORDER — FUROSEMIDE 20 MG PO TABS
20.0000 mg | ORAL_TABLET | Freq: Every day | ORAL | Status: DC
  Administered 2012-02-25: 20 mg via ORAL
  Filled 2012-02-24 (×2): qty 1

## 2012-02-24 MED ORDER — VITAMIN D3 25 MCG (1000 UNIT) PO TABS
1000.0000 [IU] | ORAL_TABLET | Freq: Every day | ORAL | Status: DC
  Administered 2012-02-25 – 2012-02-29 (×5): 1000 [IU] via ORAL
  Filled 2012-02-24 (×5): qty 1

## 2012-02-24 MED ORDER — ZOLPIDEM TARTRATE 5 MG PO TABS: 5.0000 mg | ORAL_TABLET | Freq: Every evening | ORAL | Status: DC | PRN

## 2012-02-24 MED ORDER — BIOTENE DRY MOUTH MT LIQD
15.0000 mL | Freq: Two times a day (BID) | OROMUCOSAL | Status: DC
  Administered 2012-02-25 – 2012-02-27 (×4): 15 mL via OROMUCOSAL

## 2012-02-24 MED ORDER — PANTOPRAZOLE SODIUM 40 MG PO TBEC
40.0000 mg | DELAYED_RELEASE_TABLET | Freq: Every day | ORAL | Status: DC
  Administered 2012-02-24 – 2012-02-29 (×6): 40 mg via ORAL
  Filled 2012-02-24 (×6): qty 1

## 2012-02-24 MED ORDER — HYDROCODONE-ACETAMINOPHEN 5-325 MG PO TABS
1.0000 | ORAL_TABLET | ORAL | Status: DC | PRN
  Administered 2012-02-25 – 2012-02-26 (×2): 1 via ORAL
  Filled 2012-02-24 (×2): qty 1

## 2012-02-24 MED ORDER — MEMANTINE HCL 10 MG PO TABS
10.0000 mg | ORAL_TABLET | Freq: Every day | ORAL | Status: DC
  Administered 2012-02-24 – 2012-02-29 (×6): 10 mg via ORAL
  Filled 2012-02-24 (×6): qty 1

## 2012-02-24 MED ORDER — INSULIN ASPART 100 UNIT/ML ~~LOC~~ SOLN
0.0000 [IU] | Freq: Every day | SUBCUTANEOUS | Status: DC
Start: 2012-02-24 — End: 2012-02-29

## 2012-02-24 MED ORDER — SODIUM CHLORIDE 0.9 % IV SOLN: 250.0000 mL | INTRAVENOUS | Status: DC | PRN

## 2012-02-24 MED ORDER — INSULIN ASPART 100 UNIT/ML ~~LOC~~ SOLN
0.0000 [IU] | Freq: Three times a day (TID) | SUBCUTANEOUS | Status: DC
  Administered 2012-02-29: 3 [IU] via SUBCUTANEOUS

## 2012-02-24 MED ORDER — ASPIRIN EC 81 MG PO TBEC
81.0000 mg | DELAYED_RELEASE_TABLET | Freq: Every day | ORAL | Status: DC
  Administered 2012-02-25 – 2012-02-29 (×5): 81 mg via ORAL
  Filled 2012-02-24 (×6): qty 1

## 2012-02-24 MED ORDER — NITROGLYCERIN 0.4 MG SL SUBL
0.4000 mg | SUBLINGUAL_TABLET | SUBLINGUAL | Status: DC | PRN
  Administered 2012-02-25 – 2012-02-27 (×2): 0.4 mg via SUBLINGUAL
  Filled 2012-02-24 (×2): qty 25

## 2012-02-24 MED ORDER — ALPRAZOLAM 0.25 MG PO TABS: 0.2500 mg | ORAL_TABLET | Freq: Two times a day (BID) | ORAL | Status: DC | PRN

## 2012-02-24 MED ORDER — GLYBURIDE 5 MG PO TABS
5.0000 mg | ORAL_TABLET | Freq: Two times a day (BID) | ORAL | Status: DC
  Filled 2012-02-24 (×4): qty 1

## 2012-02-24 MED ORDER — LOSARTAN POTASSIUM 50 MG PO TABS
100.0000 mg | ORAL_TABLET | Freq: Every day | ORAL | Status: DC
  Administered 2012-02-25 – 2012-02-29 (×5): 100 mg via ORAL
  Filled 2012-02-24 (×6): qty 2

## 2012-02-24 MED ORDER — DONEPEZIL HCL 5 MG PO TABS
5.0000 mg | ORAL_TABLET | Freq: Every day | ORAL | Status: DC
  Administered 2012-02-24 – 2012-02-28 (×5): 5 mg via ORAL
  Filled 2012-02-24 (×6): qty 1

## 2012-02-24 MED ORDER — LEVOTHYROXINE SODIUM 125 MCG PO TABS
125.0000 ug | ORAL_TABLET | Freq: Every day | ORAL | Status: DC
  Administered 2012-02-25 – 2012-02-29 (×5): 125 ug via ORAL
  Filled 2012-02-24 (×6): qty 1

## 2012-02-24 MED ORDER — AMLODIPINE BESYLATE 5 MG PO TABS
5.0000 mg | ORAL_TABLET | Freq: Every day | ORAL | Status: DC
  Administered 2012-02-24 – 2012-02-29 (×6): 5 mg via ORAL
  Filled 2012-02-24 (×6): qty 1

## 2012-02-24 MED ORDER — INSULIN ASPART PROT & ASPART (70-30 MIX) 100 UNIT/ML ~~LOC~~ SUSP
20.0000 [IU] | Freq: Every day | SUBCUTANEOUS | Status: DC
  Administered 2012-02-25 – 2012-02-27 (×3): 20 [IU] via SUBCUTANEOUS
  Filled 2012-02-24: qty 3

## 2012-02-24 MED ORDER — SODIUM CHLORIDE 0.9 % IJ SOLN: 3.0000 mL | INTRAMUSCULAR | Status: DC | PRN

## 2012-02-24 MED ORDER — INSULIN ASPART PROT & ASPART (70-30 MIX) 100 UNIT/ML ~~LOC~~ SUSP: 8.0000 [IU] | Freq: Two times a day (BID) | SUBCUTANEOUS | Status: DC

## 2012-02-24 MED ORDER — INSULIN ASPART PROT & ASPART (70-30 MIX) 100 UNIT/ML ~~LOC~~ SUSP
8.0000 [IU] | Freq: Every day | SUBCUTANEOUS | Status: DC
  Administered 2012-02-25 – 2012-02-28 (×2): 8 [IU] via SUBCUTANEOUS

## 2012-02-24 MED ORDER — ATORVASTATIN CALCIUM 40 MG PO TABS
40.0000 mg | ORAL_TABLET | Freq: Every day | ORAL | Status: DC
  Administered 2012-02-25 – 2012-02-28 (×4): 40 mg via ORAL
  Filled 2012-02-24 (×6): qty 1

## 2012-02-24 MED ORDER — METOPROLOL TARTRATE 50 MG PO TABS
50.0000 mg | ORAL_TABLET | Freq: Two times a day (BID) | ORAL | Status: DC
  Administered 2012-02-25 – 2012-02-26 (×2): 50 mg via ORAL
  Filled 2012-02-24 (×7): qty 1

## 2012-02-24 MED ORDER — LETROZOLE 2.5 MG PO TABS
2.5000 mg | ORAL_TABLET | Freq: Every day | ORAL | Status: DC
  Administered 2012-02-25 – 2012-02-29 (×5): 2.5 mg via ORAL
  Filled 2012-02-24 (×6): qty 1

## 2012-02-24 MED ORDER — SODIUM CHLORIDE 0.9 % IJ SOLN
3.0000 mL | Freq: Two times a day (BID) | INTRAMUSCULAR | Status: DC
  Administered 2012-02-25 – 2012-02-29 (×8): 3 mL via INTRAVENOUS

## 2012-02-24 MED ORDER — HEPARIN (PORCINE) IN NACL 100-0.45 UNIT/ML-% IJ SOLN
1000.0000 [IU]/h | INTRAMUSCULAR | Status: DC
  Administered 2012-02-24 – 2012-02-25 (×2): 1000 [IU]/h via INTRAVENOUS
  Filled 2012-02-24 (×2): qty 250

## 2012-02-24 MED ORDER — ACETAMINOPHEN 325 MG PO TABS: 650.0000 mg | ORAL_TABLET | ORAL | Status: DC | PRN

## 2012-02-24 MED ORDER — ASPIRIN EC 81 MG PO TBEC: 81.0000 mg | DELAYED_RELEASE_TABLET | Freq: Every day | ORAL | Status: DC

## 2012-02-24 NOTE — Progress Notes (Signed)
ANTICOAGULATION CONSULT NOTE - Initial Consult  Pharmacy Consult for heparin Indication: ACS, s/p cath and consult for OHS  No Known Allergies  Patient Measurements: Height: 4\' 10"  (147.3 cm) Weight: 189 lb 2.5 oz (85.8 kg) IBW/kg (Calculated) : 40.9    Vital Signs: Temp: 97.9 F (36.6 C) (04/03 1930) Temp src: Oral (04/03 1930) BP: 114/47 mmHg (04/03 2000) Pulse Rate: 64  (04/03 2000)  Labs: No results found for this basename: HGB:2,HCT:3,PLT:3,APTT:3,LABPROT:3,INR:3,HEPARINUNFRC:3,CREATININE:3,CKTOTAL:3,CKMB:3,TROPONINI:3 in the last 72 hours Estimated Creatinine Clearance: 25.5 ml/min (by C-G formula based on Cr of 1.5).  Medical History: Past Medical History  Diagnosis Date  . Hypertension   . Malignant neoplasm of upper-outer quadrant of female breast   . Esophageal reflux   . Peripheral vascular disease, unspecified   . Unspecified venous (peripheral) insufficiency   . Dementia in conditions classified elsewhere with behavioral disturbance   . Chronic kidney disease, stage II (mild)   . Hyperlipidemia   . Hypothyroidism   . Diabetes mellitus     Type 2  . Spinal stenosis June 2009    Lumbar, by MRI    Medications:  Prescriptions prior to admission  Medication Sig Dispense Refill  . amLODipine (NORVASC) 5 MG tablet Take 5 mg by mouth daily.        Marland Kitchen aspirin 81 MG tablet Take 81 mg by mouth daily.        Marland Kitchen atorvastatin (LIPITOR) 40 MG tablet Take 1 tablet (40 mg total) by mouth daily.  30 tablet  3  . cholecalciferol (VITAMIN D) 1000 UNITS tablet Take 1,000 Units by mouth daily.        Marland Kitchen donepezil (ARICEPT) 5 MG tablet Take 5 mg by mouth at bedtime.        . furosemide (LASIX) 20 MG tablet Take 20 mg by mouth daily.       Marland Kitchen glipiZIDE (GLUCOTROL) 5 MG tablet Take 5 mg by mouth 2 (two) times daily before a meal.      . glyBURIDE (DIABETA) 5 MG tablet Take 5 mg by mouth 2 (two) times daily.        Marland Kitchen HYDROcodone-acetaminophen (VICODIN) 5-500 MG per tablet Take 1  tablet by mouth every 6 (six) hours as needed for pain (or for cough).  120 tablet  1  . letrozole (FEMARA) 2.5 MG tablet Take 2.5 mg by mouth daily.        Marland Kitchen levothyroxine (SYNTHROID, LEVOTHROID) 125 MCG tablet TAKE ONE TABLET BY MOUTH DAILY  90 tablet  3  . losartan (COZAAR) 100 MG tablet Take 100 mg by mouth daily.      . memantine (NAMENDA) 10 MG tablet Take 10 mg by mouth daily.       . metoprolol (LOPRESSOR) 50 MG tablet TAKE ONE TABLET BY MOUTH EVERY 12 HOURS  180 tablet  3  . NOVOLOG MIX 70/30 (70-30) 100 UNIT/ML injection INJECT 20 UNITS BEFORE BREAKFAST AND 8 UNITS WITH EVENING MEALS  10 mL  PRN  . omeprazole (PRILOSEC) 20 MG capsule Take 20 mg by mouth daily.        Marland Kitchen RELION INSULIN SYRINGE 1ML/31G 31G X 5/16" 1 ML MISC USE AS DIRECTED TWICE DAILY  60 each  11    Assessment: 78 yo lady transferred from [facility] s/p NSTEMI and cath to be evaluated for CABG.  She is to restart heparin 6 hours after sheath pull. Goal of Therapy:  Heparin level 0.3-0.7 units/ml   Plan:  Restart heparin at  22:00 tonight at 1000 units/hr. Check heparin level and CBC in am and daily while on heparin.  Christine Gonzales 02/24/2012,8:51 PM

## 2012-02-24 NOTE — H&P (Signed)
History and Physical  Patient ID: Christine Gonzales [facility] Patient ID: Christine Gonzales [facility] MRN: [id number], DOB/AGE: [birth year] 78 y.o. Date of Encounter: 02/24/2012  Primary Physician: Duncan Dull, MD, MD Primary Cardiologist: TG  Chief Complaint:  CAD  HPI: REFER TO DATA IN PAPER CHART INCLUDING CARDIOLOGY CONSULT FROM DR Mariah Milling, ECHO REPORT AND CATH REPORT.  78 year old female with no previous history of CAD was having variable heart rates, went to [facility] and cardiac enzymes were elevated, indicating a NSTEMI. She also had CHF. She had a heart cath today which showed severe 3 vessel disease and after discussion with her daughter, was sent to [facility] to be considered for CABG. From Dr Windell Hummingbird note, "Details of cath discussed with family. They would like to pursue evaluation for CABG. Given that the left main disease is critical, heparin will be started this evening. The family would like to transfer to [facility] for further evaluation for CABG."  She remembers having chest pain but is unable to quantify it further and is not having it now.   Past Medical History  Diagnosis Date  . Hypertension   . Malignant neoplasm of upper-outer quadrant of female breast   . Esophageal reflux   . Peripheral vascular disease, unspecified   . Unspecified venous (peripheral) insufficiency   . Dementia in conditions classified elsewhere with behavioral disturbance   . Chronic kidney disease, stage II (mild)   . Hyperlipidemia   . Hypothyroidism   . Diabetes mellitus     Type 2  . Spinal stenosis June 2009    Lumbar, by MRI     Surgical History:  Past Surgical History  Procedure Date  . Abdominal hysterectomy   . Cholecystectomy   . Eye surgery     bilateral cataract removal  . Joint replacement 2003    Right Knee  . Rotator cuff April 2007  . Breast surgery 2008    right lumpectomy, infiltrating ductal CA     I have reviewed the patient's current  medications. Medication Sig  . amLODipine (NORVASC) 5 MG tablet Take 5 mg by mouth daily.    Marland Kitchen aspirin 81 MG tablet Take 81 mg by mouth daily.    Marland Kitchen atorvastatin (LIPITOR) 40 MG tablet Take 1 tablet (40 mg total) by mouth daily.  . cholecalciferol (VITAMIN D) 1000 UNITS tablet Take 1,000 Units by mouth daily.    Marland Kitchen donepezil (ARICEPT) 5 MG tablet Take 5 mg by mouth at bedtime.    . furosemide (LASIX) 20 MG tablet Take 20 mg by mouth daily.   Marland Kitchen glipiZIDE (GLUCOTROL) 5 MG tablet Take 5 mg by mouth 2 (two) times daily before a meal.  . glyBURIDE (DIABETA) 5 MG tablet Take 5 mg by mouth 2 (two) times daily.    Marland Kitchen HYDROcodone-acetaminophen (VICODIN) 5-500 MG per tablet Take 1 tablet by mouth every 6 (six) hours as needed for pain (or for cough).  Marland Kitchen letrozole (FEMARA) 2.5 MG tablet Take 2.5 mg by mouth daily.    Marland Kitchen levothyroxine (SYNTHROID, LEVOTHROID) 125 MCG tablet TAKE ONE TABLET BY MOUTH DAILY  . losartan (COZAAR) 100 MG tablet Take 100 mg by mouth daily.  . memantine (NAMENDA) 10 MG tablet Take 10 mg by mouth daily.   . metoprolol (LOPRESSOR) 50 MG tablet TAKE ONE TABLET BY MOUTH EVERY 12 HOURS  . NOVOLOG MIX 70/30 (70-30) 100 UNIT/ML injection INJECT 20 UNITS BEFORE BREAKFAST AND 8 UNITS WITH EVENING MEALS  . omeprazole (PRILOSEC) 20  MG capsule Take 20 mg by mouth daily.    Marland Kitchen RELION INSULIN SYRINGE 1ML/31G 31G X 5/16" 1 ML MISC USE AS DIRECTED TWICE DAILY    Allergies: No Known Allergies  History   Social History  . Marital Status: Widowed    Spouse Name: N/A    Number of Children: N/A  . Years of Education: N/A   Occupational History  . Retired   Social History Main Topics  . Smoking status: Former Games developer  . Smokeless tobacco: Never Used  . Alcohol Use: No  . Drug Use: No  . Sexually Active: Not on file   Social History Narrative  . Lives alone in a retirement community, not sure how long ago she quit smoking but much > 1 year. Both parents lived into their 50s but had CAD and  not all sibs are living but she is not aware of any CAD in any of them.     Family History  Problem Relation Age of Onset  . Cancer Mother     Bladder  . Coronary artery disease Mother   . Hypertension Mother   . Coronary artery disease Father   . Hypertension Father   . Heart disease Sister     Review of Systems: Pt denies any LE edema, not sure about DOE, denies any current problems but memory is poor. Full 14-point review of systems otherwise negative except as noted above.   Physical Exam: Blood pressure 121/40, temperature 97.9 F (36.6 C), temperature source Oral, resp. rate 18, height 4\' 10"  (1.473 m), weight 189 lb 2.5 oz (85.8 kg), SpO2 100.00%.  General: Well developed, well nourished, elderly female in no acute distress. Head: Normocephalic, atraumatic, sclera non-icteric, no xanthomas, nares are without discharge. Dentition: poor Neck:No carotid bruits. JVD not elevated. No thyromegally Lungs: Good expansion bilaterally, Clear bilaterally to auscultation without wheezes or rhonchi. Rales in the bases Heart: Regular rate and rhythm with S1 S2. No S3 or S4.  Soft murmur, no rubs, or gallops appreciated. Abdomen: Soft, non-tender, non-distended with normoactive bowel sounds. No hepatomegaly. No rebound/guarding. No obvious abdominal masses. Msk:  Strength and tone appear normal for age. No joint deformities or effusions, no spine or costo-vertebral angle tenderness. Extremities: No clubbing or cyanosis. No edema.  Distal pedal pulses are 1-2+ and equal bilaterally. Cath site is without ecchymosis, hematoma or bruit. Neuro: Alert and oriented X 1 (name). Moves all extremities spontaneously. No focal deficits noted. Remembers distant things but does not know she is in a hospital or the city/month/year. Psych:  Responds to questions appropriately with a normal affect but gets a little upset when she cannot remember things. Skin: No rashes or lesions noted  Labs: done at  [place]   Radiology/Studies:  At [facility]   Cardiac Cath: Done today at [place] Distal left main 95%, ostial LAD 95%, prox LAD 60%, ostial CFX 95%, ostial RCA 70%, other distal disease not elucidated here - see cath report in paper chart.  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 We do not know specifics.  Pt is listed as out of hospital DNR.  Pt tx for consideration of CABG.   She has an OOH DNR form in the chart. Her 2 daughters were present. They are joint POAs. Pt has had a living will for many years. She has consistently stated she did not wish to be resuscitated. Pt lost her husband of 56 years a few months ago and the daughters believe she would not want to be resuscitated. Will write DNR order.  Signed,  Bjorn Loser Barrett PA-C 02/24/2012, 7:58 PM  Patient seen and examined. I agree with the assessment and plan as detailed above. See also my additional thoughts below.   I have written the note above with Theodore Demark PA-C.  I have spoken with Dr. Cornelius Moras of Cardiac Surgery. He will arrange for one of his team to see patient and talk with family tomorrow afternoon.  Willa Rough, MD, [facility] 02/24/2012 8:58 PM

## 2012-02-25 ENCOUNTER — Other Ambulatory Visit: Payer: Self-pay

## 2012-02-25 ENCOUNTER — Inpatient Hospital Stay (HOSPITAL_COMMUNITY): Payer: Medicare Other

## 2012-02-25 DIAGNOSIS — E876 Hypokalemia: Secondary | ICD-10-CM

## 2012-02-25 DIAGNOSIS — I2510 Atherosclerotic heart disease of native coronary artery without angina pectoris: Secondary | ICD-10-CM

## 2012-02-25 DIAGNOSIS — Z01810 Encounter for preprocedural cardiovascular examination: Secondary | ICD-10-CM

## 2012-02-25 LAB — CBC
Hemoglobin: 8.3 g/dL — ABNORMAL LOW (ref 12.0–15.0)
MCH: 19.5 pg — ABNORMAL LOW (ref 26.0–34.0)
MCH: 19.2 pg — ABNORMAL LOW (ref 26.0–34.0)
MCHC: 29.6 g/dL — ABNORMAL LOW (ref 30.0–36.0)
MCV: 65.1 fL — ABNORMAL LOW (ref 78.0–100.0)
Platelets: 261 10*3/uL (ref 150–400)
RBC: 4.21 MIL/uL (ref 3.87–5.11)
RDW: 17.9 % — ABNORMAL HIGH (ref 11.5–15.5)

## 2012-02-25 LAB — BASIC METABOLIC PANEL
CO2: 29 mEq/L (ref 19–32)
Chloride: 101 mEq/L (ref 96–112)
Glucose, Bld: 54 mg/dL — ABNORMAL LOW (ref 70–99)
Potassium: 2.9 mEq/L — ABNORMAL LOW (ref 3.5–5.1)
Sodium: 140 mEq/L (ref 135–145)

## 2012-02-25 LAB — PULMONARY FUNCTION TEST

## 2012-02-25 LAB — GLUCOSE, CAPILLARY: Glucose-Capillary: 83 mg/dL (ref 70–99)

## 2012-02-25 MED ORDER — POTASSIUM CHLORIDE CRYS ER 20 MEQ PO TBCR
40.0000 meq | EXTENDED_RELEASE_TABLET | Freq: Three times a day (TID) | ORAL | Status: DC
  Administered 2012-02-25 – 2012-02-26 (×4): 40 meq via ORAL
  Filled 2012-02-25 (×7): qty 2

## 2012-02-25 MED ORDER — ALBUTEROL SULFATE (5 MG/ML) 0.5% IN NEBU
2.5000 mg | INHALATION_SOLUTION | Freq: Once | RESPIRATORY_TRACT | Status: AC
  Administered 2012-02-25: 2.5 mg via RESPIRATORY_TRACT

## 2012-02-25 MED ORDER — FUROSEMIDE 10 MG/ML IJ SOLN
20.0000 mg | Freq: Once | INTRAMUSCULAR | Status: AC
  Administered 2012-02-25: 20 mg via INTRAVENOUS
  Filled 2012-02-25: qty 2

## 2012-02-25 MED ORDER — ISOSORBIDE MONONITRATE ER 30 MG PO TB24
30.0000 mg | ORAL_TABLET | Freq: Every day | ORAL | Status: DC
  Administered 2012-02-25 – 2012-02-29 (×5): 30 mg via ORAL
  Filled 2012-02-25 (×6): qty 1

## 2012-02-25 NOTE — Progress Notes (Signed)
   CARE MANAGEMENT NOTE 02/25/2012  Patient:  JAYDEE, INGMAN   Account Number:  [id number]  Date Initiated:  02/25/2012  Documentation initiated by:  GRAVES-BIGELOW,Azaliah Carrero  Subjective/Objective Assessment:   Pt is a transfer from [facility] with cp- s/p cath revealed 3 vessel disease-questioning consideration for CABG. Lives alone  @ [facility].     Action/Plan:   CM did relay above information to CSW [place]. CM spoke to daughter Selena Batten and her # is [phone]. MD to speak to family this evening about possible CABG.   Anticipated DC Date:  03/02/2012   Anticipated DC Plan:  SKILLED NURSING FACILITY      DC Planning Services  CM consult      Choice offered to / List presented to:             Status of service:  In process, will continue to follow Medicare Important Message given?   (If response is "NO", the following Medicare IM given date fields will be blank) Date Medicare IM given:   Date Additional Medicare IM given:    Discharge Disposition:    Per UR Regulation:    If discussed at Long Length of Stay Meetings, dates discussed:    Comments:  02-25-12 1114 Tomi Bamberger, RN,BSN [phone] CM will continue to monitor for d/c disposition needs.

## 2012-02-25 NOTE — Progress Notes (Signed)
Pt. Describes "dry" pain "running up backbone", but points to mid chest when asked to locate. EKG stable, BP stable. Pt. Unable to further define pain.  Gave .4mg  NTG tab SL to see if pain will subside.

## 2012-02-25 NOTE — Progress Notes (Addendum)
Patient ID: Christine Gonzales [facility], female   DOB: Jan 15, 1925, 78 y.o.   MRN: [id number]   SUBJECTIVE: The patient has been stable during the night. Theodore Demark PA-C talked with the family later last night. At that time the family noted to her that they wanted the patient to be DNR. She is therefore listed this way in the hospital. The patient did not have any chest pain last night. During the day today our cardiac surgical team will see the patient and review the entire situation with the family. She has severe coronary disease. There will be a careful decision about whether she can have bypass surgery.   Filed Vitals:   02/25/12 0500 02/25/12 0600 02/25/12 0700 02/25/12 0746  BP: 126/47 138/56 145/56   Pulse: 51 56 57   Temp:    98.2 F (36.8 C)  TempSrc:    Oral  Resp: 13 15 19    Height:      Weight:      SpO2: 94% 95% 92%     Intake/Output Summary (Last 24 hours) at 02/25/12 0759 Last data filed at 02/25/12 0700  Gross per 24 hour  Intake 198.66 ml  Output    500 ml  Net -301.34 ml    LABS: Basic Metabolic Panel:  Basename 02/25/12 0548  NA 140  K 2.9*  CL 101  CO2 29  GLUCOSE 54*  BUN 20  CREATININE 1.25*  CALCIUM 8.9  MG --  PHOS --   Liver Function Tests: No results found for this basename: AST:2,ALT:2,ALKPHOS:2,BILITOT:2,PROT:2,ALBUMIN:2 in the last 72 hours No results found for this basename: LIPASE:2,AMYLASE:2 in the last 72 hours CBC:  Basename 02/25/12 0545  WBC 12.3*  NEUTROABS --  HGB 8.2*  HCT 27.4*  MCV 65.1*  PLT 261   Cardiac Enzymes: No results found for this basename: CKTOTAL:3,CKMB:3,CKMBINDEX:3,TROPONINI:3 in the last 72 hours BNP: No components found with this basename: POCBNP:3 D-Dimer: No results found for this basename: DDIMER:2 in the last 72 hours Hemoglobin A1C: No results found for this basename: HGBA1C in the last 72 hours Fasting Lipid Panel: No results found for this basename: CHOL,HDL,LDLCALC,TRIG,CHOLHDL,LDLDIRECT in the  last 72 hours Thyroid Function Tests: No results found for this basename: TSH,T4TOTAL,FREET3,T3FREE,THYROIDAB in the last 72 hours  RADIOLOGY: No results found.  PHYSICAL EXAM  Patient is resting comfortably. She is lying flat in bed. There is no jugular venous distention. Lungs reveal a few scattered rhonchi. Cardiac exam reveals S1-S2. There are no clicks or significant murmurs. The abdomen is soft. There is no significant peripheral edema.   TELEMETRY: I have reviewed telemetry. She has normal sinus rhythm.   ASSESSMENT AND PLAN:   *Non-ST elevation myocardial infarction (NSTEMI), initial care episode The patient had troponin elevation and underwent cardiac catheterization in [place]. She has critical coronary disease. Decisions will be made as the day goes on today about the possibility of proceeding with CABG.   Hypertension   Chronic kidney disease, stage 3, mod decreased GFR    Creatinine is good today at 1.2.   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 Will have to be sure to adjust her meds. For today I put her Glucotrol and glipizide on hold.she will not do well if she becomes hypoglycemic.   Hypothyroid   At this point I do not see a TSH. However it now.   CAD (coronary artery disease)   Systolic CHF, acute   The patient's overall volume  status has stabilized. She is making good urine.   Anemia   Her hemoglobin is 8.2. This adds additional stress to her overall tenuous cardiac status. This will make it even more difficult for her to be stabilized whether she has bypass surgery or not. I have chosen to proceed with transfusing her.   Hypokalemia   Potassium is 2.9. She will receive potassium.  The situation is very difficult with this elderly patient who has critical coronary disease. Cardiac surgery help will be greatly appreciated. They will have very careful discussions with the family and make  decision about possible bypass surgery.    Willa Rough 02/25/2012 7:59 AM

## 2012-02-25 NOTE — Plan of Care (Signed)
Problem: Phase I Progression Outcomes Goal: Voiding-avoid urinary catheter unless indicated Outcome: Not Progressing Pt. Being diuresed Goal: Initial discharge plan identified Outcome: Completed/Met Date Met:  02/25/12 After fine tuning medical management, plan for pt. To return to assisted living if possible.

## 2012-02-25 NOTE — Progress Notes (Signed)
02/25/2012  2200 CBG 58  240 ml orange juice and graham crackers given. Christine Gonzales 02/25/2012  2230  CBG repeated 91. Murl Zogg, Linnell Fulling

## 2012-02-25 NOTE — Progress Notes (Signed)
Clinical Social Work Department BRIEF PSYCHOSOCIAL ASSESSMENT 02/25/2012  Patient:  MIIA, BLANKS     Account Number:  [id number]     Admit date:  02/24/2012  Clinical Social Worker:  Hulan Fray  Date/Time:  02/25/2012 11:41 AM  Referred by:  Care Management  Date Referred:  02/25/2012 Referred for  Other - See comment   Other Referral:   Patient is from [facility]   Interview type:  Family Other interview type:    PSYCHOSOCIAL DATA Living Status:  FACILITY Admitted from facility:  [facility] Level of care:  Assisted Living Primary support name:  Barbaraann Boys Primary support relationship to patient:  CHILD, ADULT Degree of support available:   Supportive    CURRENT CONCERNS Current Concerns  Post-Acute Placement   Other Concerns:    SOCIAL WORK ASSESSMENT / PLAN Clinical Social Worker received referral from CM that patient was from a facility. Patient is from [facility]. Patient's daughters, French Ana [phone]) and Selena Batten [phone]) were at bedside. Per French Ana, the family is to speak with the MD today regarding CABG procedure. CSW spoke with family about possible need for SNF placement and family is in agreement to this plan of SNF facilities in [place] if the plan is for CABG. CSW will complete the [facility] for MD's signature and continue to follow for disposition.   Assessment/plan status:  Psychosocial Support/Ongoing Assessment of Needs Other assessment/ plan:   Information/referral to community resources:   SNF packet    PATIENT'S/FAMILY'S RESPONSE TO PLAN OF CARE: Patient and family are agreeable for SNF in [place] if the decision is to proceed with CABG procedure.

## 2012-02-25 NOTE — Progress Notes (Signed)
ANTICOAGULATION CONSULT NOTE - Initial Consult  Pharmacy Consult for heparin Indication: ACS, s/p cath and consult for OHS  No Known Allergies  Patient Measurements: Height: 4\' 10"  (147.3 cm) Weight: 189 lb 9.5 oz (86 kg) IBW/kg (Calculated) : 40.9    Vital Signs: Temp: 98 F (36.7 C) (04/04 0400) Temp src: Axillary (04/04 0400) BP: 138/56 mmHg (04/04 0600) Pulse Rate: 56  (04/04 0600)  Labs:  Basename 02/25/12 0548 02/25/12 0545  HGB -- 8.2*  HCT -- 27.4*  PLT -- 261  APTT -- --  LABPROT -- --  INR -- --  HEPARINUNFRC 0.37 --  CREATININE -- --  CKTOTAL -- --  CKMB -- --  TROPONINI -- --   Estimated Creatinine Clearance: 25.5 ml/min (by C-G formula based on Cr of 1.5).  Assessment: 78 yo female with NSTEMI s/p cath awaiting possible CABG for heparin.  Goal of Therapy:  Heparin level 0.3-0.7 units/ml   Plan:  Continue Heparin at current rate  F/U plan.  Eddie Candle 02/25/2012,6:22 AM

## 2012-02-25 NOTE — Progress Notes (Signed)
*  PRELIMINARY RESULTS* Vascular Ultrasound Carotid Duplex (Doppler) has been completed.  Preliminary findings: Bilateral: 40-59% ICA stenosis. Vertebral artery flow is antegrade.  Cathie Beams Deneen 02/25/2012, 3:00 PM

## 2012-02-25 NOTE — Progress Notes (Signed)
UR Completed. Simmons, Romie Tay F [phone]  

## 2012-02-25 NOTE — Progress Notes (Signed)
Pt. C/o "just not feeling good".  VSS, but color slightly pale.  Returned pt. To bed.  After adjusting for comfort, pt. States she "feels better".

## 2012-02-25 NOTE — Consult Note (Signed)
CARDIOTHORACIC SURGERY CONSULTATION REPORT  PCP is Duncan Dull, MD, MD Referring Provider is Willa Rough, MD Primary Cardiologist is Julien Nordmann, MD   Reason for consultation:  Left main disease with 3 vessel CAD  HPI:  Patient is an 78 year old recently widowed white female from [place] was transferred from [facility] for evaluation possible coronary artery bypass grafting. The patient has no previous cardiac history but past medical problems notable for dementia, type 2 diabetes mellitus, iron deficient anemia, chronic renal insufficiency, peripheral vascular disease, and a remote history of breast cancer. The patient's husband passed away several months ago. The patient suffers from dementia and has recently been living in an assisted living facility at the [address]. The patient was admitted down as [facility] on April 2 because of not feeling well complaining of cough and generalized weakness. Patient was noted to be in class IV congestive heart failure and was admitted to the hospital. Troponin levels were elevated and chest x-ray demonstrated pulmonary edema. The patient was stabilized medically and underwent cardiac catheterization. She was found to have critical left main disease with three-vessel coronary artery disease and severe left ventricular dysfunction. She was transferred to [facility] to consider possible coronary artery bypass grafting.   Past Medical History  Diagnosis Date  . Hypertension   . Malignant neoplasm of upper-outer quadrant of female breast   . Esophageal reflux   . Peripheral vascular disease, unspecified   . Unspecified venous (peripheral) insufficiency   . Dementia in conditions classified elsewhere with behavioral disturbance   . Chronic kidney disease, stage II (mild)   . Hyperlipidemia   . Hypothyroidism   . Diabetes mellitus     Type 2  . Spinal stenosis June 2009    Lumbar, by MRI   . Dementia     Past Surgical History  Procedure Date  . Abdominal hysterectomy   . Cholecystectomy   . Eye surgery     bilateral cataract removal  . Joint replacement 2003    Right Knee  . Rotator cuff April 2007  . Breast surgery 2008    right lumpectomy, infiltrating ductal CA    Family History  Problem Relation Age of Onset  . Cancer Mother     Bladder  . Coronary artery disease Mother   . Hypertension Mother   . Coronary artery disease Father   . Hypertension Father   . Heart disease Sister     Social History History  Substance Use Topics  . Smoking status: Former Games developer  . Smokeless tobacco: Never Used  . Alcohol Use: No    Prior to Admission medications   Medication Sig Start Date End Date Taking? Authorizing Provider  aspirin 81 MG tablet Take 81 mg by mouth daily.     Yes Historical Provider, MD  atorvastatin (LIPITOR) 40 MG tablet Take 40 mg by mouth daily.   Yes Historical Provider, MD  cetaphil (CETAPHIL) cream Apply 1 application topically at bedtime. Apply to legs   Yes Historical Provider, MD  cholecalciferol (VITAMIN D) 1000 UNITS tablet Take 1,000 Units by mouth daily.     Yes Historical Provider, MD  docusate sodium (COLACE) 100 MG capsule Take 100 mg by mouth 2 (two) times daily.   Yes Historical Provider, MD  donepezil (ARICEPT) 5 MG tablet Take 5 mg by mouth at bedtime.     Yes Historical Provider, MD  furosemide (LASIX) 20 MG tablet Take 20  mg by mouth daily.    Yes Historical Provider, MD  glipiZIDE (GLUCOTROL) 5 MG tablet Take 2.5-5 mg by mouth 2 (two) times daily before a meal. Take one tablet in the morning and 0.5 tablets in the evening   Yes Historical Provider, MD  HYDROcodone-acetaminophen (VICODIN) 5-500 MG per tablet Take 1 tablet by mouth every 6 (six) hours as needed. For pain   Yes Historical Provider, MD  insulin aspart protamine-insulin aspart (NOVOLOG 70/30) (70-30) 100 UNIT/ML injection Inject 8-20 Units into the skin 4 (four)  times daily. Take 20 units in the morning and 8 units with each meal   Yes Historical Provider, MD  letrozole (FEMARA) 2.5 MG tablet Take 2.5 mg by mouth daily.     Yes Historical Provider, MD  levothyroxine (SYNTHROID, LEVOTHROID) 125 MCG tablet Take 125 mcg by mouth daily.   Yes Historical Provider, MD  losartan (COZAAR) 100 MG tablet Take 100 mg by mouth daily.   Yes Historical Provider, MD  memantine (NAMENDA) 10 MG tablet Take 10 mg by mouth daily.    Yes Historical Provider, MD  metoprolol (LOPRESSOR) 50 MG tablet Take 50 mg by mouth 2 (two) times daily.   Yes Historical Provider, MD  nystatin cream (MYCOSTATIN) Apply 1 application topically 2 (two) times daily. Apply to affected areas   Yes Historical Provider, MD  omeprazole (PRILOSEC) 20 MG capsule Take 20 mg by mouth daily.     Yes Historical Provider, MD  RELION INSULIN SYRINGE 1ML/31G 31G X 5/16" 1 ML MISC USE AS DIRECTED TWICE DAILY 09/23/11  Yes Sherlene Shams, MD    Current Facility-Administered Medications  Medication Dose Route Frequency Provider Last Rate Last Dose  . 0.9 %  sodium chloride infusion  250 mL Intravenous PRN Joline Salt Barrett, PA 10 mL/hr at 02/24/12 2013 250 mL at 02/24/12 2013  . acetaminophen (TYLENOL) tablet 650 mg  650 mg Oral Q4H PRN Rhonda G Barrett, PA      . albuterol (PROVENTIL) (5 MG/ML) 0.5% nebulizer solution 2.5 mg  2.5 mg Nebulization Once Kerin Perna, MD   2.5 mg at 02/25/12 1155  . ALPRAZolam (XANAX) tablet 0.25 mg  0.25 mg Oral BID PRN Joline Salt Barrett, PA      . amLODipine (NORVASC) tablet 5 mg  5 mg Oral Daily Joline Salt Barrett, PA   5 mg at 02/25/12 0915  . antiseptic oral rinse (BIOTENE) solution 15 mL  15 mL Mouth Rinse BID Herby Abraham, MD      . aspirin EC tablet 81 mg  81 mg Oral Daily Joline Salt Barrett, PA   81 mg at 02/25/12 0916  . atorvastatin (LIPITOR) tablet 40 mg  40 mg Oral q1800 Rhonda G Barrett, PA      . cholecalciferol (VITAMIN D) tablet 1,000 Units  1,000 Units Oral  Daily Joline Salt Barrett, PA   1,000 Units at 02/25/12 0915  . donepezil (ARICEPT) tablet 5 mg  5 mg Oral QHS Rhonda G Barrett, PA   5 mg at 02/24/12 2149  . furosemide (LASIX) injection 20 mg  20 mg Intravenous Once Luis Abed, MD   20 mg at 02/25/12 1519  . furosemide (LASIX) injection 20 mg  20 mg Intravenous Once Luis Abed, MD      . furosemide (LASIX) tablet 20 mg  20 mg Oral Daily Joline Salt Barrett, PA   20 mg at 02/25/12 0915  . heparin ADULT infusion 100 units/mL (25000 units/250 mL)  1,000 Units/hr Intravenous Continuous Herby Abraham, MD 10 mL/hr at 02/25/12 0800 1,000 Units/hr at 02/25/12 0800  . HYDROcodone-acetaminophen (NORCO) 5-325 MG per tablet 1 tablet  1 tablet Oral Q4H PRN Rhonda G Barrett, PA      . insulin aspart (novoLOG) injection 0-15 Units  0-15 Units Subcutaneous TID WC Rhonda G Barrett, PA      . insulin aspart (novoLOG) injection 0-5 Units  0-5 Units Subcutaneous QHS Rhonda G Barrett, PA      . insulin aspart protamine-insulin aspart (NOVOLOG 70/30) injection 20 Units  20 Units Subcutaneous Q breakfast Herby Abraham, MD   20 Units at 02/25/12 [phone]  . insulin aspart protamine-insulin aspart (NOVOLOG 70/30) injection 8 Units  8 Units Subcutaneous Q supper Herby Abraham, MD      . isosorbide mononitrate (IMDUR) 24 hr tablet 30 mg  30 mg Oral Daily Luis Abed, MD   30 mg at 02/25/12 0916  . letrozole [facility]) tablet 2.5 mg  2.5 mg Oral Daily Joline Salt Barrett, PA   2.5 mg at 02/25/12 0916  . levothyroxine (SYNTHROID, LEVOTHROID) tablet 125 mcg  125 mcg Oral QAC breakfast Joline Salt Barrett, PA   125 mcg at 02/25/12 0759  . losartan (COZAAR) tablet 100 mg  100 mg Oral Daily Joline Salt Barrett, PA   100 mg at 02/25/12 0916  . memantine (NAMENDA) tablet 10 mg  10 mg Oral Daily Joline Salt Barrett, PA   10 mg at 02/25/12 0915  . metoprolol (LOPRESSOR) tablet 50 mg  50 mg Oral BID Joline Salt Barrett, PA   50 mg at 02/25/12 0915  . nitroGLYCERIN (NITROSTAT) SL tablet 0.4  mg  0.4 mg Sublingual Q5 Min x 3 PRN Joline Salt Barrett, PA   0.4 mg at 02/25/12 0837  . ondansetron (ZOFRAN) injection 4 mg  4 mg Intravenous Q6H PRN Rhonda G Barrett, PA      . pantoprazole (PROTONIX) EC tablet 40 mg  40 mg Oral Q1200 Rhonda G Barrett, PA   40 mg at 02/25/12 1149  . potassium chloride SA (K-DUR,KLOR-CON) CR tablet 40 mEq  40 mEq Oral TID WC Luis Abed, MD   40 mEq at 02/25/12 1149  . sodium chloride 0.9 % injection 3 mL  3 mL Intravenous Q12H Rhonda G Barrett, PA      . sodium chloride 0.9 % injection 3 mL  3 mL Intravenous PRN Joline Salt Barrett, PA      . zolpidem (AMBIEN) tablet 5 mg  5 mg Oral QHS PRN Darrol Jump, PA      . DISCONTD: aspirin EC tablet 81 mg  81 mg Oral Daily Rhonda G Barrett, PA      . DISCONTD: glipiZIDE (GLUCOTROL) tablet 5 mg  5 mg Oral BID AC Rhonda G Barrett, PA   5 mg at 02/25/12 0758  . DISCONTD: glyBURIDE (DIABETA) tablet 5 mg  5 mg Oral BID WC Rhonda G Barrett, PA      . DISCONTD: insulin aspart protamine-insulin aspart (NOVOLOG 70/30) injection 8-20 Units  8-20 Units Subcutaneous BID WC Rhonda G Barrett, PA        No Known Allergies  Review of Systems:  General:  The patient has serious short and long-term memory dysfunction with significant cognitive dysfunction related to dementia. She answers questions been consistently in the majority of her review of systems is provided by her daughters at the bedside.   At present the patient reports she has  no shortness of breath no chest pain in her only complaint is pain in one of the toes of her feet which burns constantly.  She is reported to have normal appetite, decreased energy   Respiratory:  + cough, + wheezing, no hemoptysis, no pain with inspiration or cough, + shortness of breath  Cardiac:   no chest pain or tightness, + exertional SOB, + resting SOB, no PND, no orthopnea, + LE edema, no palpitations, no syncope  GI:   no difficulty swallowing, no hematochezia, no hematemesis, no melena, no  constipation, no diarrhea  GU:   no dysuria, no urgency, no frequency  Musculoskeletal: + arthritis, + arthralgia   Vascular:  no pain suggestive of claudication but chronic pain in both feet  Neuro:   no symptoms suggestive of TIA's, no seizures, no headaches, + peripheral neuropathy, + significant dementia  Endocrine:  Type II DM  HEENT:  no loose teeth or painful teeth,  no recent vision changes  Psych:   no anxiety, no depression    Physical Exam:   BP 113/59  Pulse 55  Temp(Src) 98.5 F (36.9 C) (Oral)  Resp 13  Ht 4\' 10"  (1.473 m)  Wt 86 kg (189 lb 9.5 oz)  BMI 39.63 kg/m2  SpO2 100%  General:  Obese,  Pleasant but confused, ill-appearing  HEENT:  Unremarkable   Neck:   no JVD, no bruits, no adenopathy   Chest:    symmetrical breath sounds, no wheezes, + rhonchi   CV:   RRR, no  murmur   Abdomen:  soft, non-tender, no masses   Extremities:  warm, adequately-perfused, pulses not palpable, severe bilateral LE edema and chronic microvascular insufficiency  Rectal/GU  Deferred  Neuro:   Grossly non-focal and symmetrical throughout, moderate dementia with poor short term memory  Skin:   Clean and dry, no rashes, no breakdown  Diagnostic Tests:  Cardiac catheterization performed yesterday [facility] is reviewed. There is 90-95% stenosis of the left main coronary artery with 99% ostial stenosis of left circumflex coronary artery. The left anterior descending coronary artery is diffusely diseased with multiple 90% lesions and relative to the poor target vessel for grafting. The left circumflex gives rise to a large obtuse marginal branch. There is codominant coronary circulation. The right coronary artery is small and diffusely diseased with 90% stenosis. There is severe left ventricular dysfunction with severe anteroapical and inferoapical hypokinesis with an ejection fraction estimated at less than 20%.   Impression:  This elderly patient with multiple  medical problems who suffers from dementia has critical left main disease with three-vessel coronary artery disease and severe left ventricular dysfunction with ischemic cardiomyopathy and class IV congestive heart failure. I do not feel that this patient should be considered a candidate for coronary artery bypass grafting.   Plan:  I recommend palliative medical care.  I discussed matters at length with the patient and her 2 daughters at the bedside. Neither the patient nor her family are interested in any sort of aggressive or heroic measures to prolong her life. There completely in agreement with plans for palliative care. All of their questions been addressed.    Salvatore Decent. Cornelius Moras, MD 02/25/2012 3:50 PM

## 2012-02-26 DIAGNOSIS — I2510 Atherosclerotic heart disease of native coronary artery without angina pectoris: Secondary | ICD-10-CM

## 2012-02-26 DIAGNOSIS — F0390 Unspecified dementia without behavioral disturbance: Secondary | ICD-10-CM | POA: Diagnosis present

## 2012-02-26 LAB — CBC
HCT: 35.6 % — ABNORMAL LOW (ref 36.0–46.0)
Hemoglobin: 11.2 g/dL — ABNORMAL LOW (ref 12.0–15.0)
WBC: 11.5 10*3/uL — ABNORMAL HIGH (ref 4.0–10.5)

## 2012-02-26 LAB — TYPE AND SCREEN: Unit division: 0

## 2012-02-26 LAB — BASIC METABOLIC PANEL
BUN: 18 mg/dL (ref 6–23)
Chloride: 103 mEq/L (ref 96–112)
Glucose, Bld: 104 mg/dL — ABNORMAL HIGH (ref 70–99)
Potassium: 4.5 mEq/L (ref 3.5–5.1)

## 2012-02-26 LAB — GLUCOSE, CAPILLARY
Glucose-Capillary: 65 mg/dL — ABNORMAL LOW (ref 70–99)
Glucose-Capillary: 166 mg/dL — ABNORMAL HIGH (ref 70–99)
Glucose-Capillary: 104 mg/dL — ABNORMAL HIGH (ref 70–99)

## 2012-02-26 LAB — HEPARIN LEVEL (UNFRACTIONATED): Heparin Unfractionated: 0.62 IU/mL (ref 0.30–0.70)

## 2012-02-26 MED ORDER — FUROSEMIDE 40 MG PO TABS
40.0000 mg | ORAL_TABLET | Freq: Every day | ORAL | Status: DC
  Administered 2012-02-26 – 2012-02-29 (×4): 40 mg via ORAL
  Filled 2012-02-26 (×3): qty 1

## 2012-02-26 MED ORDER — GUAIFENESIN-DM 100-10 MG/5ML PO SYRP
5.0000 mL | ORAL_SOLUTION | ORAL | Status: DC | PRN
  Administered 2012-02-26 – 2012-02-29 (×10): 5 mL via ORAL
  Filled 2012-02-26 (×10): qty 5

## 2012-02-26 MED ORDER — ENOXAPARIN SODIUM 100 MG/ML ~~LOC~~ SOLN
1.0000 mg/kg | Freq: Once | SUBCUTANEOUS | Status: DC
Start: 2012-02-26 — End: 2012-02-26
  Filled 2012-02-26: qty 1

## 2012-02-26 MED ORDER — ENOXAPARIN SODIUM 80 MG/0.8ML ~~LOC~~ SOLN
80.0000 mg | Freq: Two times a day (BID) | SUBCUTANEOUS | Status: DC
  Administered 2012-02-27 – 2012-02-29 (×5): 80 mg via SUBCUTANEOUS
  Filled 2012-02-26 (×9): qty 0.8

## 2012-02-26 MED ORDER — DEXTROSE 50 % IV SOLN
INTRAVENOUS | Status: AC
  Administered 2012-02-26: 50 mL
  Filled 2012-02-26: qty 50

## 2012-02-26 MED ORDER — ENOXAPARIN SODIUM 80 MG/0.8ML ~~LOC~~ SOLN
80.0000 mg | Freq: Once | SUBCUTANEOUS | Status: AC
  Administered 2012-02-26: 80 mg via SUBCUTANEOUS
  Filled 2012-02-26: qty 0.8

## 2012-02-26 NOTE — Progress Notes (Signed)
02/26/2012   0445  Repeat CBG 166 Regnia Mathwig, Linnell Fulling

## 2012-02-26 NOTE — Progress Notes (Signed)
ANTICOAGULATION CONSULT NOTE - Follow Up Consult  Pharmacy Consult for Heparin Indication: chest pain/ACS s/p cath with 3vd, plan for med mgmt  No Known Allergies  Patient Measurements: Height: 4\' 10"  (147.3 cm) Weight: 184 lb 8.4 oz (83.7 kg) IBW/kg (Calculated) : 40.9  Heparin Dosing Weight: 64  Vital Signs: Temp: 98 F (36.7 C) (04/05 0737) Temp src: Oral (04/05 0737) BP: 125/45 mmHg (04/05 1000) Pulse Rate: 64  (04/05 1000)  Labs:  Basename 02/26/12 0650 02/25/12 0839 02/25/12 0548 02/25/12 0545  HGB 11.2* 8.3* -- --  HCT 35.6* 28.0* -- 27.4*  PLT 241 270 -- 261  APTT -- -- -- --  LABPROT -- -- -- --  INR -- -- -- --  HEPARINUNFRC 0.62 -- 0.37 --  CREATININE 1.19* -- 1.25* --  CKTOTAL -- -- -- --  CKMB -- -- -- --  TROPONINI -- -- -- --   Estimated Creatinine Clearance: 31.6 ml/min (by C-G formula based on Cr of 1.19).   Medications:  Infusions:    . heparin 1,000 Units/hr (02/25/12 2006)    Assessment: 78 yo F s/p cath with 3 vessel CAD with plans for medication management.  Heparin level is therapeutic on 1000 units/hr.    Goal of Therapy:  Heparin level 0.3-0.7 units/ml   Plan:  Continue heparin at 1000 units/hr.   Follow-up daily Heparin level and CBC labs.  [employer], Pharm.D., BCPS Clinical Pharmacist Pager [phone] 02/26/2012 10:20 AM

## 2012-02-26 NOTE — Progress Notes (Addendum)
Patient ID: [facility], female   DOB: June 29, 1925, 78 y.o.   MRN: [id number]   SUBJECTIVE:  The patient is not having any pain or shortness of breath this morning. I appreciate greatly the complete evaluation by Dr.Owen yesterday. It is clear that the patient is not a candidate for bypass surgery. The family is in agreement with this. The plan therefore will be to treat her medically, get her as stable as possible, and return her to her assisted living facility.   Filed Vitals:   02/26/12 0441 02/26/12 0500 02/26/12 0600 02/26/12 0737  BP:  136/59 130/93 131/53  Pulse:  61 54 61  Temp:    98 F (36.7 C)  TempSrc:    Oral  Resp:  13 11 12   Height:      Weight: 184 lb 8.4 oz (83.7 kg)     SpO2:  99% 99% 100%    Intake/Output Summary (Last 24 hours) at 02/26/12 0808 Last data filed at 02/26/12 0600  Gross per 24 hour  Intake   2385 ml  Output   1680 ml  Net    705 ml    LABS: Basic Metabolic Panel:  Basename 02/26/12 0650 02/25/12 0548  NA 141 140  K 4.5 2.9*  CL 103 101  CO2 27 29  GLUCOSE 104* 54*  BUN 18 20  CREATININE 1.19* 1.25*  CALCIUM 9.0 8.9  MG -- --  PHOS -- --   Liver Function Tests: No results found for this basename: AST:2,ALT:2,ALKPHOS:2,BILITOT:2,PROT:2,ALBUMIN:2 in the last 72 hours No results found for this basename: LIPASE:2,AMYLASE:2 in the last 72 hours CBC:  Basename 02/26/12 0650 02/25/12 0839  WBC 11.5* 12.3*  NEUTROABS -- --  HGB 11.2* 8.3*  HCT 35.6* 28.0*  MCV 69.7* 64.8*  PLT 241 270   Cardiac Enzymes: No results found for this basename: CKTOTAL:3,CKMB:3,CKMBINDEX:3,TROPONINI:3 in the last 72 hours BNP: No components found with this basename: POCBNP:3 D-Dimer: No results found for this basename: DDIMER:2 in the last 72 hours Hemoglobin A1C: No results found for this basename: HGBA1C in the last 72 hours Fasting Lipid Panel: No results found for this basename: CHOL,HDL,LDLCALC,TRIG,CHOLHDL,LDLDIRECT in the last 72  hours Thyroid Function Tests:  Basename 02/25/12 0839  TSH 0.843  T4TOTAL --  T3FREE --  THYROIDAB --    RADIOLOGY: No results found.  PHYSICAL EXAM  The patient is comfortable in bed. Lungs reveal scattered rhonchi. There is no respiratory distress. Cardiac exam is S1 and S2. There no significant murmurs. The abdomen is soft. There continues to be edema in the legs. There is slight redness in both legs also from the edema.   TELEMETRY: I have reviewed telemetry. The rhythm is normal sinus.   ASSESSMENT AND PLAN:   *Non-ST elevation myocardial infarction (NSTEMI), initial care episode   The patient is not having any recurrent chest pain. She has had complete evaluation for the possibility of bypass surgery and it is felt she is not a candidate. Everyone is in agreement. She is on the appropriate cardiac medications for her at this time. No change in therapy.   Hypertension Blood pressure is controlled at this time. No change in therapy.   Chronic kidney disease, stage 3, mod decreased GFR  Her renal function has been stable and continues to be stable. We will watch it carefully as we tried to diuresis her a little further.   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   Systolic CHF, acute  The patient is stabilizing from her CHF. Diuresis is to continue.   Anemia   I chose to transfuse the patient yesterday. Her hemoglobin is now up to 11. This can be followed.   Hypokalemia  Potassium increased from 2.9-4.5 over the past day. I want to be sure that we do not overshoot. Potassium was put on hold for today. It can be reassessed tomorrow.   Dementia   Under the stress of this hospitalization the patient's overall dementia symptoms are probably worse than at baseline.The hope is that she can be stabilized medically and that when she gets back to her usual setting her mental status will  improve.  The overall plan is to stabilize her the best possible medically for her coronary disease and left ventricular dysfunction and CHF and then plan return to her assisted living. I have signed the FL2 form. Her Foley catheter will be discontinued this morning.The patient will be transferred to telemetry.   Willa Rough 02/26/2012 8:08 AM

## 2012-02-26 NOTE — Progress Notes (Signed)
02/26/2012  4:16 AM   CBG 68  Refused to eat or drink anything. D50 one amp given IV. Ciena Sampley, Linnell Fulling

## 2012-02-27 ENCOUNTER — Other Ambulatory Visit: Payer: Self-pay

## 2012-02-27 ENCOUNTER — Encounter (HOSPITAL_COMMUNITY): Payer: Self-pay | Admitting: Internal Medicine

## 2012-02-27 DIAGNOSIS — M25559 Pain in unspecified hip: Secondary | ICD-10-CM

## 2012-02-27 LAB — GLUCOSE, CAPILLARY
Glucose-Capillary: 116 mg/dL — ABNORMAL HIGH (ref 70–99)
Glucose-Capillary: 62 mg/dL — ABNORMAL LOW (ref 70–99)
Glucose-Capillary: 69 mg/dL — ABNORMAL LOW (ref 70–99)
Glucose-Capillary: 111 mg/dL — ABNORMAL HIGH (ref 70–99)

## 2012-02-27 LAB — CBC
HCT: 34.9 % — ABNORMAL LOW (ref 36.0–46.0)
Hemoglobin: 10.7 g/dL — ABNORMAL LOW (ref 12.0–15.0)
RBC: 4.94 MIL/uL (ref 3.87–5.11)
RDW: 21 % — ABNORMAL HIGH (ref 11.5–15.5)
WBC: 13.2 10*3/uL — ABNORMAL HIGH (ref 4.0–10.5)

## 2012-02-27 LAB — BASIC METABOLIC PANEL
BUN: 19 mg/dL (ref 6–23)
CO2: 28 mEq/L (ref 19–32)
Chloride: 102 mEq/L (ref 96–112)
GFR calc Af Amer: 45 mL/min — ABNORMAL LOW (ref >90)
Potassium: 4.4 mEq/L (ref 3.5–5.1)

## 2012-02-27 MED ORDER — SPIRONOLACTONE 12.5 MG HALF TABLET
12.5000 mg | ORAL_TABLET | Freq: Every day | ORAL | Status: DC
  Administered 2012-02-27 – 2012-02-29 (×3): 12.5 mg via ORAL
  Filled 2012-02-27 (×3): qty 1

## 2012-02-27 MED ORDER — CLOPIDOGREL BISULFATE 75 MG PO TABS
75.0000 mg | ORAL_TABLET | Freq: Every day | ORAL | Status: DC
  Administered 2012-02-28 – 2012-02-29 (×2): 75 mg via ORAL
  Filled 2012-02-27 (×2): qty 1

## 2012-02-27 MED ORDER — METOPROLOL TARTRATE 25 MG PO TABS
25.0000 mg | ORAL_TABLET | Freq: Two times a day (BID) | ORAL | Status: DC
  Administered 2012-02-27 – 2012-02-29 (×5): 25 mg via ORAL
  Filled 2012-02-27 (×6): qty 1

## 2012-02-27 MED ORDER — GLUCOSE-VITAMIN C 4-6 GM-MG PO CHEW
CHEWABLE_TABLET | ORAL | Status: AC
  Filled 2012-02-27: qty 1

## 2012-02-27 NOTE — Evaluation (Signed)
Physical Therapy Evaluation Patient Details Name: Christine Gonzales [facility] MRN: [id number] DOB: January 24, 1925 Today's Date: 02/27/2012  Problem List:  Patient Active Problem List  Diagnoses  . Arteriosclerotic dementia  . Hypertension  . Chronic kidney disease, stage 3, mod decreased GFR  . Venous hypertension, chronic, with inflammation  . Peripheral vascular disease due to secondary diabetes mellitus  . Personal history of breast cancer  . History of basal cell carcinoma excision  . Diabetes mellitus with atherosclerosis of arteries of extremities  . Hypothyroid  . Spinal stenosis of lumbar region  . Bright red blood per rectum  . Bronchitis  . Non-ST elevation myocardial infarction (NSTEMI), initial care episode  . CAD (coronary artery disease)  . Systolic CHF, acute  . Anemia  . Hypokalemia  . Dementia    Past Medical History:  Past Medical History  Diagnosis Date  . Hypertension   . Malignant neoplasm of upper-outer quadrant of female breast   . Esophageal reflux   . Peripheral vascular disease, unspecified   . Unspecified venous (peripheral) insufficiency   . Dementia in conditions classified elsewhere with behavioral disturbance   . Chronic kidney disease, stage II (mild)   . Hyperlipidemia   . Hypothyroidism   . Diabetes mellitus     Type 2  . Spinal stenosis June 2009    Lumbar, by MRI  . Dementia   . CAD (coronary artery disease)     3VCAD w LM  >>med Rx 3/13;; EF 20-25%  . Chronic systolic heart failure    Past Surgical History:  Past Surgical History  Procedure Date  . Abdominal hysterectomy   . Cholecystectomy   . Eye surgery     bilateral cataract removal  . Joint replacement 2003    Right Knee  . Rotator cuff April 2007  . Breast surgery 2008    right lumpectomy, infiltrating ductal CA    PT Assessment/Plan/Recommendation Clinical Impression Statement: Pt admitted in transfer from [facility] due to MI. Pt found not to be an operable  candidate for blockages and will be treated medically. Daughters very supportive and agreeable to whatever pt needs. Pt mobilizing well and should be ready for return to ALF on 02/29/12. PT Recommendation/Assessment: Patient will need skilled PT in the acute care venue PT Problem List: Decreased balance;Decreased mobility;Decreased knowledge of use of DME;Pain Barriers to Discharge: None PT Therapy Diagnosis : Difficulty walking;Acute pain PT Frequency: Min 3X/week PT Treatment/Interventions: DME instruction;Gait training;Functional mobility training;Therapeutic activities;Patient/family education Recommendations for Other Services: OT consult Follow Up Recommendations: No PT follow up Equipment Recommended: None recommended by PT PT Goals  Acute Rehab PT Goals PT Goal Formulation: With patient/family Time For Goal Achievement: 7 days Pt will go Supine/Side to Sit: with supervision;with HOB 0 degrees PT Goal: Supine/Side to Sit - Progress: Goal set today Pt will go Sit to Supine/Side: with supervision;with HOB 0 degrees PT Goal: Sit to Supine/Side - Progress: Goal set today Pt will go Sit to Stand: with supervision;with upper extremity assist PT Goal: Sit to Stand - Progress: Goal set today Pt will go Stand to Sit: with supervision;with upper extremity assist PT Goal: Stand to Sit - Progress: Goal set today Pt will Ambulate: 51 - 150 feet;with supervision;with least restrictive assistive device PT Goal: Ambulate - Progress: Goal set today  PT Evaluation Precautions/Restrictions  Precautions Precautions: Fall Precaution Comments: Pt safe to ambulate with min-guard assist; RN and family aware and agree to assist pt to ambulate 2-3 x/day Prior Functioning  Home Living Lives With: Other (Comment) (resident at ALF) Receives Help From: Personal care attendant Type of Home: Assisted living Home Layout: One level Home Access: Ramped entrance Bathroom Toilet: Standard Bathroom  Accessibility: Yes How Accessible: Accessible via walker Home Adaptive Equipment: Walker - four wheeled;Straight cane Additional Comments: Pt normally uses 4 wheeled walker with seat Prior Function Level of Independence: Independent with gait;Requires assistive device for independence Driving: No Cognition Cognition Arousal/Alertness: Awake/alert Overall Cognitive Status: History of cognitive impairments History of Cognitive Impairment: Appears at baseline functioning (per family) Orientation Level: Oriented to person;Disoriented to place;Disoriented to time;Disoriented to situation Sensation/Coordination   Extremity Assessment RLE Assessment RLE Assessment: Within Functional Limits (+ edema; daughter reports at baseline) LLE Assessment LLE Assessment: Within Functional Limits (+ edema; daughter reports at baseline) Mobility (including Balance) Bed Mobility Bed Mobility: No Transfers Transfers: Yes Sit to Stand: 4: Min assist;With upper extremity assist;With armrests;From chair/3-in-1;From toilet Sit to Stand Details (indicate cue type and reason): minguard assist for safety; vc for safe use of RW; use of grab bar from toilet;  Stand to Sit: 4: Min assist;With upper extremity assist;With armrests;To chair/3-in-1;To toilet Stand to Sit Details: minguard assist for safety; vc for safe use of RW; use of grab bar to toilet;  Ambulation/Gait Ambulation/Gait: Yes Ambulation/Gait Assistance: 4: Min assist Ambulation/Gait Assistance Details (indicate cue type and reason): loss of balance posteriorly x 1 requiring min assist (pt "jumped" due to stabbing pain in groin); otherwise min-guard assist with cues to keep RW closer to her. (Family has her 4 wheeled walker and will bring it in--familiar to pt) Ambulation Distance (Feet): 110 Feet (10 ft to bathroom, then 100 ft) Assistive device: Rolling walker Gait Pattern: Step-through pattern;Decreased stride length;Trunk flexed Gait velocity:  significantly decr  Posture/Postural Control Posture/Postural Control: No significant limitations (in sitting) Balance Balance Assessed: Yes Static Sitting Balance Static Sitting - Balance Support: No upper extremity supported;Feet supported Static Sitting - Level of Assistance: 7: Independent Static Sitting - Comment/# of Minutes: edge of chair Exercise  General Exercises - Lower Extremity Ankle Circles/Pumps: AROM;Both;15 reps;Seated;Other (comment) (legs elevated; daughters to encourage to assist c edema) End of Session PT - End of Session Equipment Utilized During Treatment: Gait belt Activity Tolerance: Patient tolerated treatment well Patient left: in chair;with family/visitor present (call bell with daughter) Nurse Communication: Mobility status for ambulation General Behavior During Session: [facility] for tasks performed  Karletta Millay 02/27/2012, 2:37 PM  Pager [phone]

## 2012-02-27 NOTE — Progress Notes (Signed)
1630  pt c/o chest pain after ambulating to BR. B/p 145/75 HR 74. Nitro given, pt. Also coughing in spasm like bout. cbg done 69. EKG done O2 on for comfort. 1700 pt with noted relief with chest pain and cough cbg 90. Rhonda Barrett updated on pt. Condition and ordered to hold 70/30 insulin dose this shift. Will cont. To monitor pt.

## 2012-02-27 NOTE — Progress Notes (Signed)
Patient Name: Christine Gonzales [facility]      SUBJECTIVE: admitted [facility] with CP and NSTEMI  Cath>>3v CAD with LM  And tx to [facility] for consideration of CABG;  Surgery hwoever was not offered by [facility] b/c of multiple comorbidities and dementia; family was agreeable   The patient denies SOB, chest pain edema or palpitations.    Past Medical History  Diagnosis Date  . Hypertension   . Malignant neoplasm of upper-outer quadrant of female breast   . Esophageal reflux   . Peripheral vascular disease, unspecified   . Unspecified venous (peripheral) insufficiency   . Dementia in conditions classified elsewhere with behavioral disturbance   . Chronic kidney disease, stage II (mild)   . Hyperlipidemia   . Hypothyroidism   . Diabetes mellitus     Type 2  . Spinal stenosis June 2009    Lumbar, by MRI  . Dementia     PHYSICAL EXAM Filed Vitals:   02/26/12 1236 02/26/12 2127 02/26/12 2130 02/27/12 0610  BP: 119/71 130/46 130/46 132/41  Pulse: 66 67 67 65  Temp: 98.2 F (36.8 C)  98.2 F (36.8 C) 98.4 F (36.9 C)  TempSrc:      Resp: 18  14 18   Height:      Weight:    216 lb 4.3 oz (98.1 kg)  SpO2: 99%  98% 98%   Well developed and nourished in no acute distress HENT normal Neck supple with JVP-flat Clear Regular rate and rhythm, no murmurs or gallops Abd-soft with active BS No Clubbing cyanosis trace edema Skin-warm and dry A & Oriented  Grossly normal sensory and motor function  TELEMETRY: Reviewed telemetry pt in sinus w some bradycardia:    Intake/Output Summary (Last 24 hours) at 02/27/12 0807 Last data filed at 02/27/12 0021  Gross per 24 hour  Intake     23 ml  Output    250 ml  Net   -227 ml    LABS: Basic Metabolic Panel:  Lab 02/27/12 1610 02/26/12 0650 02/25/12 0548  NA 140 141 140  K 4.4 4.5 2.9*  CL 102 103 101  CO2 28 27 29   GLUCOSE 68* 104* 54*  BUN 19 18 20   CREATININE 1.23* 1.19* 1.25*  CALCIUM 8.9 9.0 --  MG -- -- --  PHOS -- -- --   Cardiac  Enzymes: No results found for this basename: CKTOTAL:3,CKMB:3,CKMBINDEX:3,TROPONINI:3 in the last 72 hours CBC:  Lab 02/27/12 0545 02/26/12 0650 02/25/12 0839 02/25/12 0545  WBC 13.2* 11.5* 12.3* 12.3*  NEUTROABS -- -- -- --  HGB 10.7* 11.2* 8.3* 8.2*  HCT 34.9* 35.6* 28.0* 27.4*  MCV 70.6* 69.7* 64.8* 65.1*  PLT 255 241 270 261     Basename 02/25/12 0839  TSH 0.843  T4TOTAL --  T3FREE --  THYROIDAB --     ASSESSMENT AND PLAN:  Patient Active Hospital Problem List: Non-ST elevation myocardial infarction (NSTEMI), initial care episode (02/24/2012)  Hypertension (07/28/2011)  Chronic kidney disease, stage 3, mod decreased GFR (07/28/2011)   Diabetes mellitus with atherosclerosis of arteries of extremities (07/28/2011)  Hypothyroid (07/28/2011)  CAD   Systolic CHF, acute (02/24/2012)   Anemia (02/24/2012)   Hypokalemia (02/25/2012)   Dementia (02/26/2012)  Bradycardia   Patient presented with a nondistended. He has medical anticipated coronary disease. We'll add Plavix.  Will continue lmwh X 72 hrs  She has anemia with that is microcytic. For iron deficiency iron replacement get some benefit. She has severe left ventricular dysfunction therapies  are limited by bradycardia. We will add Aldactone. She'll need close followup of her potassium and her renal function though hyperkalemia has not been a recent issue.  Will reduce metop 2/2 brady Anticipate discharge on Monday    Signed, Sherryl Manges MD  02/27/2012

## 2012-02-28 ENCOUNTER — Inpatient Hospital Stay (HOSPITAL_COMMUNITY): Payer: Medicare Other

## 2012-02-28 LAB — CBC
HCT: 36.7 % (ref 36.0–46.0)
MCH: 22 pg — ABNORMAL LOW (ref 26.0–34.0)
MCV: 70.7 fL — ABNORMAL LOW (ref 78.0–100.0)
RBC: 5.19 MIL/uL — ABNORMAL HIGH (ref 3.87–5.11)
RDW: 22.2 % — ABNORMAL HIGH (ref 11.5–15.5)
WBC: 12.4 10*3/uL — ABNORMAL HIGH (ref 4.0–10.5)

## 2012-02-28 LAB — IRON AND TIBC
Saturation Ratios: 6 % — ABNORMAL LOW (ref 20–55)
UIBC: 268 ug/dL (ref 125–400)

## 2012-02-28 LAB — GLUCOSE, CAPILLARY
Glucose-Capillary: 93 mg/dL (ref 70–99)
Glucose-Capillary: 106 mg/dL — ABNORMAL HIGH (ref 70–99)
Glucose-Capillary: 107 mg/dL — ABNORMAL HIGH (ref 70–99)

## 2012-02-28 LAB — PRO B NATRIURETIC PEPTIDE: Pro B Natriuretic peptide (BNP): 29323 pg/mL — ABNORMAL HIGH (ref 0–450)

## 2012-02-28 MED ORDER — INSULIN ASPART PROT & ASPART (70-30 MIX) 100 UNIT/ML ~~LOC~~ SUSP
10.0000 [IU] | Freq: Every day | SUBCUTANEOUS | Status: DC
  Administered 2012-02-28 – 2012-02-29 (×2): 10 [IU] via SUBCUTANEOUS
  Filled 2012-02-28: qty 3

## 2012-02-28 NOTE — Progress Notes (Signed)
Patient Name: Christine Gonzales      SUBJECTIVE: admitted [facility] with CP and NSTEMI  Cath>>3v CAD with LM  And tx to [facility] for consideration of CABG;  Surgery hwoever was not offered by [facility] b/c of multiple comorbidities and dementia; family was agreeable   The patient denies SOB, chest pain edema or palpitations.    Past Medical History  Diagnosis Date  . Hypertension   . Malignant neoplasm of upper-outer quadrant of female breast   . Esophageal reflux   . Peripheral vascular disease, unspecified   . Unspecified venous (peripheral) insufficiency   . Dementia in conditions classified elsewhere with behavioral disturbance   . Chronic kidney disease, stage II (mild)   . Hyperlipidemia   . Hypothyroidism   . Diabetes mellitus     Type 2  . Spinal stenosis June 2009    Lumbar, by MRI  . Dementia   . CAD (coronary artery disease)     3VCAD w LM  >>med Rx 3/13;; EF 20-25%  . Chronic systolic heart failure     PHYSICAL EXAM Filed Vitals:   02/27/12 1400 02/27/12 2100 02/27/12 2128 02/28/12 0500  BP: 118/56 125/51 124/64 135/56  Pulse: 61 72 62 63  Temp: 98.8 F (37.1 C) 98.6 F (37 C)  98.7 F (37.1 C)  TempSrc: Oral     Resp: 16 16  18   Height:      Weight:    219 lb 5.7 oz (99.5 kg)  SpO2: 97% 95%  99%   Well developed and nourished in no acute distress HENT normal Neck supple with JVP-flat Clear Regular rate and rhythm, no murmurs or gallops Abd-soft with active BS No Clubbing cyanosis trace edema Skin-warm and dry A & Oriented  Grossly normal sensory and motor function  TELEMETRY: Reviewed telemetry pt in sinus w some bradycardia:    Intake/Output Summary (Last 24 hours) at 02/28/12 0904 Last data filed at 02/27/12 1800  Gross per 24 hour  Intake    600 ml  Output    800 ml  Net   -200 ml    LABS: Basic Metabolic Panel:  Lab 02/27/12 1610 02/26/12 0650 02/25/12 0548  NA 140 141 140  K 4.4 4.5 2.9*  CL 102 103 101  CO2 28 27 29   GLUCOSE 68*  104* 54*  BUN 19 18 20   CREATININE 1.23* 1.19* 1.25*  CALCIUM 8.9 9.0 --  MG -- -- --  PHOS -- -- --   Cardiac Enzymes: No results found for this basename: CKTOTAL:3,CKMB:3,CKMBINDEX:3,TROPONINI:3 in the last 72 hours CBC:  Lab 02/28/12 0645 02/27/12 0545 02/26/12 0650 02/25/12 0839 02/25/12 0545  WBC 12.4* 13.2* 11.5* 12.3* 12.3*  NEUTROABS -- -- -- -- --  HGB 11.4* 10.7* 11.2* 8.3* 8.2*  HCT 36.7 34.9* 35.6* 28.0* 27.4*  MCV 70.7* 70.6* 69.7* 64.8* 65.1*  PLT 233 255 241 270 261    No results found for this basename: TSH,T4TOTAL,FREET3,T3FREE,THYROIDAB in the last 72 hours   ASSESSMENT AND PLAN:  Patient Active Hospital Problem List: Non-ST elevation myocardial infarction (NSTEMI), initial care episode (02/24/2012)  Hypertension (07/28/2011)  Chronic kidney disease, stage 3, mod decreased GFR (07/28/2011)   Diabetes mellitus with atherosclerosis of arteries of extremities (07/28/2011)  CAD   Systolic CHF, acute (02/24/2012)   Anemia (02/24/2012)   Cough ? CHF v Bronchitis   Dementia (02/26/2012)  Bradycardia   Patient presented with a nondistended. He has medical anticipated coronary disease. We'll add Plavix.  Will continue  lmwh X 72 hrs  She has anemia with that is microcytic. For iron deficiency iron replacement get some benefit. She has severe left ventricular dysfunction therapies are limited by bradycardia.       Will reduce metop 2/2 brady;  HR better today Anticipate discharge on Monday  Check BMET in am   Check BNP and CXR    Signed, Sherryl Manges MD  02/28/2012

## 2012-02-29 LAB — CBC
HCT: 34.7 % — ABNORMAL LOW (ref 36.0–46.0)
Hemoglobin: 10.8 g/dL — ABNORMAL LOW (ref 12.0–15.0)
MCHC: 31.1 g/dL (ref 30.0–36.0)
WBC: 12.7 10*3/uL — ABNORMAL HIGH (ref 4.0–10.5)

## 2012-02-29 LAB — CULTURE, BLOOD (SINGLE)

## 2012-02-29 LAB — GLUCOSE, CAPILLARY: Glucose-Capillary: 118 mg/dL — ABNORMAL HIGH (ref 70–99)

## 2012-02-29 MED ORDER — NITROGLYCERIN 0.4 MG SL SUBL: 0.4000 mg | SUBLINGUAL_TABLET | SUBLINGUAL | Status: AC | PRN

## 2012-02-29 MED ORDER — INSULIN ASPART PROT & ASPART (70-30 MIX) 100 UNIT/ML ~~LOC~~ SUSP: 8.0000 [IU] | Freq: Two times a day (BID) | SUBCUTANEOUS | Status: DC

## 2012-02-29 MED ORDER — INSULIN ASPART PROT & ASPART (70-30 MIX) 100 UNIT/ML ~~LOC~~ SUSP: 8.0000 [IU] | Freq: Four times a day (QID) | SUBCUTANEOUS | Status: DC

## 2012-02-29 MED ORDER — CLOPIDOGREL BISULFATE 75 MG PO TABS: 75.0000 mg | ORAL_TABLET | Freq: Every day | ORAL | Status: DC

## 2012-02-29 MED ORDER — SPIRONOLACTONE 25 MG PO TABS: 12.5000 mg | ORAL_TABLET | Freq: Every day | ORAL | Status: AC

## 2012-02-29 MED ORDER — FUROSEMIDE 40 MG PO TABS: 40.0000 mg | ORAL_TABLET | Freq: Every day | ORAL | Status: DC

## 2012-02-29 MED ORDER — AMLODIPINE BESYLATE 5 MG PO TABS: 5.0000 mg | ORAL_TABLET | Freq: Every day | ORAL | Status: DC

## 2012-02-29 MED ORDER — ISOSORBIDE MONONITRATE ER 30 MG PO TB24: 30.0000 mg | ORAL_TABLET | Freq: Every day | ORAL | Status: DC

## 2012-02-29 NOTE — Discharge Summary (Signed)
CARDIOLOGY DISCHARGE SUMMARY   Patient ID: Christine Gonzales [facility] MRN: [id number] DOB/AGE: June 24, 1925 78 y.o.  Admit date: 02/24/2012 Discharge date: 02/29/2012  Primary Discharge Diagnosis: NSTEMI, initial episode of care Secondary Discharge Diagnosis:  Patient Active Problem List  Diagnoses  . Arteriosclerotic dementia  . Hypertension  . Chronic kidney disease, stage 3, mod decreased GFR  . Venous hypertension, chronic, with inflammation  . Peripheral vascular disease due to secondary diabetes mellitus  . Personal history of breast cancer  . History of basal cell carcinoma excision  . Diabetes mellitus with atherosclerosis of arteries of extremities  . Hypothyroid  . Spinal stenosis of lumbar region  . Bright red blood per rectum  . Bronchitis  . Non-ST elevation myocardial infarction (NSTEMI), initial care episode  . CAD (coronary artery disease)  . Systolic CHF, acute  . Anemia  . Hypokalemia  . Dementia    Consults: Dr Cornelius Moras with [facility]   Procedures: Cardiac Cath Done at [place]:  There is 90-95% stenosis of the left main coronary artery with 99% ostial stenosis of left circumflex coronary artery. The left anterior descending coronary artery is diffusely diseased with multiple 90% lesions and relative to the poor target vessel for grafting. The left circumflex gives rise to a large obtuse marginal branch. There is codominant coronary circulation. The right coronary artery is small and diffusely diseased with 90% stenosis. There is severe left ventricular dysfunction with severe anteroapical and inferoapical hypokinesis with an ejection fraction estimated at less than 20%.  Carotid Dopplers done 02/25/2012 here: Bilateral: 40-59% ICA stenosis (low range of scale). Vertebral artery flow is antegrade.  Hospital Course: Mr. is an 78 year old female with no previous history of coronary artery disease. She went to [facility] with chest pain. She ruled in for a non-ST  segment elevation MI and had a cath which showed severe three-vessel disease. She was sent to [facility] or consideration of bypass surgery and further management.  Dr. Cornelius Moras with TCT saw Ms. Payeur. This elderly patient with multiple medical problems who suffers from dementia has critical left main disease with three-vessel coronary artery disease and severe left ventricular dysfunction with ischemic cardiomyopathy and class IV congestive heart failure. I do not feel that this patient should be considered a candidate for coronary artery bypass grafting.  Ms. Giannone had an out-of-hospital DO NOT RESUSCITATE form and this was discussed with the family who wished to continue it. She was anticoagulated with Lovenox for 48 hours. She had some heart failure and required diuresis. Her potassium was low at times and was supplemented. Her TSH was checked and was within normal limits. She was anemic and was transfused up to a hemoglobin of approximately 11. She was seen by physical therapy and they followed her while she was here but felt no further followup was needed.  She had been started on a beta blocker but the dose was decreased for bradycardia. Plavix was added to her medication regimen. Amlodipine was continued for blood pressure and anginal control properties. Physical therapy continue to follow her and felt she was back at her baseline.  On 02/29/2012, she was seen by Dr. Myrtis Ser. She was ambulating without chest pain or shortness of breath and considered stable for discharge, to followup as an outpatient. Labs:   Lab Results  Component Value Date   WBC 12.7* 02/29/2012   HGB 10.8* 02/29/2012   HCT 34.7* 02/29/2012   MCV 71.0* 02/29/2012   PLT 250 02/29/2012    Lab  02/27/12 0545  NA 140  K 4.4  CL 102  CO2 28  BUN 19  CREATININE 1.23*  CALCIUM 8.9  PROT --  BILITOT --  ALKPHOS --  ALT --  AST --  GLUCOSE 68*    Pro B Natriuretic peptide (BNP)  Date/Time Value Range Status  02/28/2012  9:23 AM  29323.0* 0-450 (pg/mL) Final        Radiology: Dg Chest 2vsame Day 02/28/2012  *RADIOLOGY REPORT*  Clinical Data: Cough.  History of hypertension and morbid obesity. Smoker.  CHEST - 2 VIEW SAME DAY  Comparison: None  Findings: Heart size appears mildly enlarged.  There are small bilateral pleural effusions and mild interstitial edema.  No airspace consolidation identified.  Multilevel spondylosis within the thoracic spine is noted.  IMPRESSION:  1.  Mild CHF.  Original Report Authenticated By: Rosealee Albee, M.D.    FOLLOW UP PLANS AND APPOINTMENTS No Known Allergies Medication List  As of 02/29/2012  2:08 PM   ASK your doctor about these medications         amLODipine 5 MG tablet   Commonly known as: NORVASC   Take 5 mg by mouth daily.      aspirin 81 MG tablet   Take 81 mg by mouth daily.      atorvastatin 40 MG tablet   Commonly known as: LIPITOR   Take 40 mg by mouth daily.      cetaphil cream   Apply 1 application topically at bedtime. Apply to legs      cholecalciferol 1000 UNITS tablet   Commonly known as: VITAMIN D   Take 1,000 Units by mouth daily.      docusate sodium 100 MG capsule   Commonly known as: COLACE   Take 100 mg by mouth 2 (two) times daily.      donepezil 5 MG tablet   Commonly known as: ARICEPT   Take 5 mg by mouth at bedtime.      furosemide 20 MG tablet   Commonly known as: LASIX   Take 20 mg by mouth daily.      glipiZIDE 5 MG tablet   Commonly known as: GLUCOTROL   Take 2.5-5 mg by mouth 2 (two) times daily before a meal. Take one tablet in the morning and 0.5 tablets in the evening      glyBURIDE 5 MG tablet   Commonly known as: DIABETA   Take 5 mg by mouth 2 (two) times daily.      HYDROcodone-acetaminophen 5-500 MG per tablet   Commonly known as: VICODIN   Take 1 tablet by mouth every 6 (six) hours as needed. For pain      insulin aspart protamine-insulin aspart (70-30) 100 UNIT/ML injection   Commonly known as: NOVOLOG 70/30    Inject 8-20 Units into the skin 4 (four) times daily. Take 20 units in the morning and 8 units with each meal      letrozole 2.5 MG tablet   Commonly known as: FEMARA   Take 2.5 mg by mouth daily.      levothyroxine 125 MCG tablet   Commonly known as: SYNTHROID, LEVOTHROID   Take 125 mcg by mouth daily.      losartan 100 MG tablet   Commonly known as: COZAAR   Take 100 mg by mouth daily.      memantine 10 MG tablet   Commonly known as: NAMENDA   Take 10 mg by mouth daily.  metoprolol 50 MG tablet   Commonly known as: LOPRESSOR   Take 50 mg by mouth 2 (two) times daily.      NOVOLOG MIX 70/30 (70-30) 100 UNIT/ML injection   Generic drug: insulin aspart protamine-insulin aspart   INJECT 20 UNITS BEFORE BREAKFAST AND 8 UNITS WITH EVENING MEALS      nystatin cream   Commonly known as: MYCOSTATIN   Apply 1 application topically 2 (two) times daily. Apply to affected areas      omeprazole 20 MG capsule   Commonly known as: PRILOSEC   Take 20 mg by mouth daily.      RELION INSULIN SYRINGE 1ML/31G 31G X 5/16" 1 ML Misc   Generic drug: Insulin Syringe-Needle U-100   USE AS DIRECTED TWICE DAILY             BRING ALL MEDICATIONS WITH YOU TO FOLLOW UP APPOINTMENTS  Time spent with patient to include physician time: 40 min Signed: Theodore Demark 02/29/2012, 2:08 PM Patient seen and examined. I agree with the assessment and plan as detailed above. See also my additional thoughts below.   See also my complete progress note from today.   Willa Rough, MD, [facility] 02/29/2012 5:03 PM

## 2012-02-29 NOTE — Progress Notes (Signed)
Clinical Social Worker spoke with patient's daughter, French Ana about the anticipated discharge back to ALF today. CSW contacted facility and they are able to receive her back today if medically stable. Family plans to transport via private vehicle. CSW will continue to follow.   Rozetta Nunnery MSW, [employer] [phone]

## 2012-02-29 NOTE — Progress Notes (Signed)
I agree with the assessments and medication administration by Shary Key student.

## 2012-02-29 NOTE — Progress Notes (Signed)
Pt. Alert and stable. Discharge instructions received and reviewed with patient and family. Patient to be discharged to ALF via private transport. Christine Gonzales, Cheryll Dessert

## 2012-02-29 NOTE — Progress Notes (Signed)
Physical Therapy Treatment Patient Details Name: Sofiya Ezelle [facility] MRN: [id number] DOB: [birth year] Today's Date: 02/29/2012  PT Assessment/Plan Comments on Treatment Session: Pt s/p MI and now mobilizing near/at baseline. Anticipate is at a level to manage at the ALF. Pt managed all hygiene in restroom without physical assist. PT Plan: Discharge plan remains appropriate;Frequency remains appropriate PT Frequency: Min 3X/week Recommendations for Other Services: OT consult Follow Up Recommendations: No PT follow up Equipment Recommended: None recommended by PT PT Goals  Acute Rehab PT Goals PT Goal: Supine/Side to Sit - Progress: Progressing toward goal PT Goal: Sit to Stand - Progress: Met PT Goal: Stand to Sit - Progress: Met PT Goal: Ambulate - Progress: Met  PT Treatment Precautions/Restrictions  Precautions Precautions: Fall Precaution Comments: Red socks donned per RN request Restrictions Weight Bearing Restrictions: No Mobility (including Balance) Bed Mobility Bed Mobility: Yes Rolling Left: 4: Min assist Rolling Left Details (indicate cue type and reason): no rail; pt required cues to pass through sidelying prior to sitting (attempting unsuccessfully to transition supine to sit) Left Sidelying to Sit: 5: Supervision;HOB flat Left Sidelying to Sit Details (indicate cue type and reason): vc for encouragement and technique Sitting - Scoot to Edge of Bed: 5: Supervision Sitting - Scoot to Edge of Bed Details (indicate cue type and reason): for safety due to decr cognition/safety Transfers Sit to Stand: 5: Supervision;With upper extremity assist;With armrests;From bed;From chair/3-in-1;From toilet Sit to Stand Details (indicate cue type and reason): pt remembers to lock her 4 wheel walker prior to standing; cues to not pull up on RW and push from surface (x3) Stand to Sit: 5: Supervision;With upper extremity assist;With armrests;To chair/3-in-1;To toilet Stand to Sit Details:  cues for safe hand placement Ambulation/Gait Ambulation/Gait Assistance: 5: Supervision Ambulation/Gait Assistance Details (indicate cue type and reason): no loss of balance; tends to push seated RW too far ahead (handles lowered at end of session--and brakes tightened); able to talk with no dyspnea during ambulation Ambulation Distance (Feet): 110 Feet (10 ft to bathroom, then 100 ft) Assistive device: 4-wheeled walker Gait Pattern: Step-through pattern;Decreased stride length;Trunk flexed    Exercise    End of Session PT - End of Session Activity Tolerance: Patient tolerated treatment well Patient left: in chair;with family/visitor present;with call bell in reach;Other (comment) (RN in to set up chair alarm) Nurse Communication: Mobility status for ambulation General Behavior During Session: [facility] for tasks performed Cognition: Impaired, at baseline  Jaquelinne Glendening 02/29/2012, 9:46 AM Pager [phone]

## 2012-02-29 NOTE — Progress Notes (Signed)
Clinical Social Worker facilitated discharge by contacting family and facility. Patient's discharge packet will be transported with family who is taking patient back to facility by private vehicle. Patient's DNR form is inside discharge packet. CSW will sign off as social work intervention is no longer needed.   Rozetta Nunnery MSW, [employer] [phone]

## 2012-02-29 NOTE — Progress Notes (Signed)
Patient ID: Christine [facility], female   DOB: [birth year], 78 y.o.   MRN: [id number]   SUBJECTIVE:  The patient is stable today. 2 of her daughters are in the room. Everyone is in agreement that she can't oh home to her assisted living facility today. We will be finalizing those plans.   Filed Vitals:   02/29/12 0500 02/29/12 0850 02/29/12 0910 02/29/12 0929  BP: 134/55   125/66  Pulse: 66 76 90 83  Temp: 98.4 F (36.9 C)     TempSrc:      Resp: 18     Height:      Weight: 220 lb 10.9 oz (100.1 kg)     SpO2: 97% 99% 99%     Intake/Output Summary (Last 24 hours) at 02/29/12 1026 Last data filed at 02/28/12 1800  Gross per 24 hour  Intake    333 ml  Output      0 ml  Net    333 ml    LABS: Basic Metabolic Panel:  Basename 02/27/12 0545  NA 140  K 4.4  CL 102  CO2 28  GLUCOSE 68*  BUN 19  CREATININE 1.23*  CALCIUM 8.9  MG --  PHOS --   Liver Function Tests: No results found for this basename: AST:2,ALT:2,ALKPHOS:2,BILITOT:2,PROT:2,ALBUMIN:2 in the last 72 hours No results found for this basename: LIPASE:2,AMYLASE:2 in the last 72 hours CBC:  Basename 02/29/12 0500 02/28/12 0645  WBC 12.7* 12.4*  NEUTROABS -- --  HGB 10.8* 11.4*  HCT 34.7* 36.7  MCV 71.0* 70.7*  PLT 250 233   Cardiac Enzymes: No results found for this basename: CKTOTAL:3,CKMB:3,CKMBINDEX:3,TROPONINI:3 in the last 72 hours BNP: No components found with this basename: POCBNP:3 D-Dimer: No results found for this basename: DDIMER:2 in the last 72 hours Hemoglobin A1C: No results found for this basename: HGBA1C in the last 72 hours Fasting Lipid Panel: No results found for this basename: CHOL,HDL,LDLCALC,TRIG,CHOLHDL,LDLDIRECT in the last 72 hours Thyroid Function Tests: No results found for this basename: TSH,T4TOTAL,FREET3,T3FREE,THYROIDAB in the last 72 hours  RADIOLOGY: Dg Chest 2vsame Day  02/28/2012  *RADIOLOGY REPORT*  Clinical Data: Cough.  History of hypertension and morbid obesity.  Smoker.  CHEST - 2 VIEW SAME DAY  Comparison: None  Findings: Heart size appears mildly enlarged.  There are small bilateral pleural effusions and mild interstitial edema.  No airspace consolidation identified.  Multilevel spondylosis within the thoracic spine is noted.  IMPRESSION:  1.  Mild CHF.  Original Report Authenticated By: Rosealee Albee, M.D.    PHYSICAL EXAM   The patient is oriented to person time and place. Affect is normal. Cardiac exam reveals S1 and S2. There no clicks or significant murmurs. Lung exam reveals a few scattered rhonchi. She has only trace per full edema.   TELEMETRY:  I have reviewed telemetry. There is normal sinus rhythm.   ASSESSMENT AND PLAN:   *Non-ST elevation myocardial infarction (NSTEMI), initial care episode   The patient had a non-ST elevation MI in [place]. Catheterization revealed critical disease with poor LV function. She was transferred for consideration of CABG. After careful review by surgery and the patient's family it was felt that CABG was not recommended. She has been treated medically for her coronary disease and heart failure and is now ready for discharge.   Hypertension   Chronic kidney disease, stage 3, mod decreased GFR   Renal function actually stabilized during the hospitalization.   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   CAD (coronary artery disease)   Systolic CHF, acute   Her volume status was stabilized with diuresis and she is stable now. She does have severe left ventricular dysfunction.   Anemia  The patient's hemoglobin was quite low. I did choose to transfuse her with 2 units of packed red cells. She tolerated this well and her hemoglobin has remained stable. It is certainly a concern that she is on dual antiplatelet therapy with his history of a decreased hemoglobin. However I'm not aware of any active GI  bleeding.   Hypokalemia   Potassium was treated and is stable.   Dementia  There is dementia present. She is oriented and has very good communication with her daughters.  She is now ready for discharge. We will arrange for her to see Dr. Mariah Milling in the [place] office. He knows her from her hospitalization there and he did her catheterization. He can see her post hospitalization to be sure that all her medications are stable and that she is stable.   Willa Rough 02/29/2012 10:26 AM

## 2012-03-14 ENCOUNTER — Ambulatory Visit (INDEPENDENT_AMBULATORY_CARE_PROVIDER_SITE_OTHER): Payer: Medicare Other | Admitting: Cardiovascular Disease

## 2012-03-14 ENCOUNTER — Encounter: Payer: Self-pay | Admitting: Cardiovascular Disease

## 2012-03-14 VITALS — BP 148/68 | HR 58 | Ht <= 58 in | Wt 183.5 lb

## 2012-03-14 DIAGNOSIS — I509 Heart failure, unspecified: Secondary | ICD-10-CM

## 2012-03-14 DIAGNOSIS — I5022 Chronic systolic (congestive) heart failure: Secondary | ICD-10-CM

## 2012-03-14 DIAGNOSIS — I2510 Atherosclerotic heart disease of native coronary artery without angina pectoris: Secondary | ICD-10-CM

## 2012-03-14 DIAGNOSIS — I6529 Occlusion and stenosis of unspecified carotid artery: Secondary | ICD-10-CM

## 2012-03-14 NOTE — Assessment & Plan Note (Signed)
The patient was recently found to have significant three-vessel and left main disease with severe LV systolic dysfunction. She was deemed to be not a candidate for CABG due to her age, dementia and multiple comorbidities. She seems to be doing reasonably well on medical therapy. The plan is to keep her on dual antiplatelet therapy as long as tolerated. Continue other cardiac medications.

## 2012-03-14 NOTE — Patient Instructions (Signed)
follow up with Dr. Mariah Milling in 1 month

## 2012-03-14 NOTE — Assessment & Plan Note (Signed)
Ejection fraction was 20-25%. She appears to be euvolemic at this time. She is on a beta blocker and an ARB. We might consider switching metoprolol to carvedilol in the future. I think one issue will need to clarify is whether to continue treatment with spironolactone. Obviously, there is an indication for this due to her systolic heart failure. However, this has to be balanced against the risk of renal failure and hyperkalemia. She had labs done last week but the results are not available. If her potassium is less than 5 and creatinine is less than 1.5, it's probably reasonable to continue treatment with a small dose spironolactone. Otherwise, it might have to be stopped.

## 2012-03-14 NOTE — Progress Notes (Signed)
HPI  This is an 78 year old female who is here today for a followup visit. She presented last month to [facility] with non-ST elevation myocardial infarction. She underwent cardiac catheterization which showed severe three-vessel and left main disease. Ejection fraction was 20-25%. She was transferred to [facility] and was seen by Dr. Cornelius Moras who felt that the patient was not a candidate for coronary artery bypass graft surgery. The patient was treated medically. She did have an anemia and required blood transfusion. She also had fluid overload improved with furosemide. Also spironolactone was added. The patient has been doing well since her hospital discharge. She denies any chest pain or significant dyspnea. She is accompanied today by her daughter. There is no orthopnea or PND. Her lower extremity edema has improved.  No Known Allergies   Current Outpatient Prescriptions on File Prior to Visit  Medication Sig Dispense Refill  . amLODipine (NORVASC) 5 MG tablet Take 1 tablet (5 mg total) by mouth daily.  30 tablet  11  . aspirin 81 MG tablet Take 81 mg by mouth daily.        Marland Kitchen atorvastatin (LIPITOR) 40 MG tablet Take 40 mg by mouth daily.      . cetaphil (CETAPHIL) cream Apply 1 application topically at bedtime. Apply to legs      . cholecalciferol (VITAMIN D) 1000 UNITS tablet Take 1,000 Units by mouth daily.        . clopidogrel (PLAVIX) 75 MG tablet Take 1 tablet (75 mg total) by mouth daily with breakfast.  30 tablet  11  . docusate sodium (COLACE) 100 MG capsule Take 100 mg by mouth 2 (two) times daily.      Marland Kitchen donepezil (ARICEPT) 5 MG tablet Take 5 mg by mouth at bedtime.        Marland Kitchen escitalopram (LEXAPRO) 20 MG tablet Take 20 mg by mouth daily.       Marland Kitchen HYDROcodone-acetaminophen (VICODIN) 5-500 MG per tablet Take 1 tablet by mouth every 6 (six) hours as needed. For pain      . isosorbide mononitrate (IMDUR) 30 MG 24 hr tablet Take 1 tablet (30 mg total) by mouth daily.  30 tablet  11  .  letrozole (FEMARA) 2.5 MG tablet Take 2.5 mg by mouth daily.        Marland Kitchen levothyroxine (SYNTHROID, LEVOTHROID) 125 MCG tablet Take 125 mcg by mouth daily.      Marland Kitchen losartan (COZAAR) 100 MG tablet Take 100 mg by mouth daily.      . memantine (NAMENDA) 10 MG tablet Take 10 mg by mouth daily.       . metoprolol (LOPRESSOR) 50 MG tablet Take 50 mg by mouth 2 (two) times daily.      . nitroGLYCERIN (NITROSTAT) 0.4 MG SL tablet Place 1 tablet (0.4 mg total) under the tongue every 5 (five) minutes x 3 doses as needed for chest pain.  25 tablet  3  . nystatin cream (MYCOSTATIN) Apply 1 application topically 2 (two) times daily. Apply to affected areas      . omeprazole (PRILOSEC) 20 MG capsule Take 20 mg by mouth daily.        Marland Kitchen spironolactone (ALDACTONE) 25 MG tablet Take 0.5 tablets (12.5 mg total) by mouth daily.  20 tablet  11  . DISCONTD: furosemide (LASIX) 40 MG tablet Take 1 tablet (40 mg total) by mouth daily.  30 tablet  11     Past Medical History  Diagnosis Date  .  Hypertension   . Malignant neoplasm of upper-outer quadrant of female breast   . Esophageal reflux   . Peripheral vascular disease, unspecified   . Unspecified venous (peripheral) insufficiency   . Dementia in conditions classified elsewhere with behavioral disturbance   . Chronic kidney disease, stage II (mild)   . Hyperlipidemia   . Hypothyroidism   . Diabetes mellitus     Type 2  . Spinal stenosis June 2009    Lumbar, by MRI  . Dementia   . Chronic systolic heart failure   . CAD (coronary artery disease)     3VCAD w LM  >>med Rx 3/13;; EF 20-25%  . Carotid artery occlusion     40-59% bilateral     Past Surgical History  Procedure Date  . Abdominal hysterectomy   . Cholecystectomy   . Eye surgery     bilateral cataract removal  . Joint replacement 2003    Right Knee  . Rotator cuff April 2007  . Breast surgery 2008    right lumpectomy, infiltrating ductal CA     Family History  Problem Relation Age of  Onset  . Cancer Mother     Bladder  . Coronary artery disease Mother   . Hypertension Mother   . Coronary artery disease Father   . Hypertension Father   . Heart disease Sister      History   Social History  . Marital Status: Married    Spouse Name: N/A    Number of Children: N/A  . Years of Education: N/A   Occupational History  . Not on file.   Social History Main Topics  . Smoking status: Former Games developer  . Smokeless tobacco: Never Used  . Alcohol Use: No  . Drug Use: No  . Sexually Active: Not on file   Other Topics Concern  . Not on file   Social History Narrative   Lives alone in a retirement community, not sure how long ago she quit smoking but much > 1 year. Both parents lived into their 63s but had CAD and not all sibs are living but she is not aware of any CAD in any of them.      PHYSICAL EXAM   BP 148/68  Pulse 58  Ht 4\' 10"  (1.473 m)  Wt 183 lb 8 oz (83.235 kg)  BMI 38.35 kg/m2  Constitutional: She is oriented to person, place. Poor memory overall especially for medical details. She appears well-developed and well-nourished. No distress.  HENT: No nasal discharge.  Head: Normocephalic and atraumatic.  Eyes: Pupils are equal and round. Right eye exhibits no discharge. Left eye exhibits no discharge.  Neck: Normal range of motion. Neck supple. No JVD present. No thyromegaly present.  Cardiovascular: Normal rate, regular rhythm, normal heart sounds. Exam reveals no gallop and no friction rub. No murmur heard.  Pulmonary/Chest: Effort normal and breath sounds normal. No stridor. No respiratory distress. She has no wheezes. She has no rales. She exhibits no tenderness.  Abdominal: Soft. Bowel sounds are normal. She exhibits no distension. There is no tenderness. There is no rebound and no guarding.  Musculoskeletal: Normal range of motion. She exhibits +1 edema and no tenderness.  Neurological: She is alert and oriented to person, place, and time.  Coordination normal.  Skin: Skin is warm and dry. Chronic stasis dermatitis. She is not diaphoretic. No erythema. No pallor.  Psychiatric: She has a normal mood and affect. Her behavior is normal. Judgment and thought content normal.  ASSESSMENT AND PLAN

## 2012-03-14 NOTE — Assessment & Plan Note (Signed)
The treatment for this at this point is similar to treating her coronary artery disease. No need for future carotid duplex ultrasound as it would not change her overall management.

## 2012-03-29 ENCOUNTER — Emergency Department: Payer: Self-pay | Admitting: Emergency Medicine

## 2012-03-29 LAB — URINALYSIS, COMPLETE
Bilirubin,UR: NEGATIVE
Hyaline Cast: 14
Ketone: NEGATIVE
Nitrite: NEGATIVE
Ph: 5 (ref 4.5–8.0)
Protein: NEGATIVE
RBC,UR: 1 /HPF (ref 0–5)
Specific Gravity: 1.014 (ref 1.003–1.030)
WBC UR: 196 /HPF (ref 0–5)

## 2012-03-29 LAB — COMPREHENSIVE METABOLIC PANEL
Albumin: 3.4 g/dL (ref 3.4–5.0)
Anion Gap: 7 (ref 7–16)
Bilirubin,Total: 0.4 mg/dL (ref 0.2–1.0)
Chloride: 104 mmol/L (ref 98–107)
Co2: 27 mmol/L (ref 21–32)
Creatinine: 1.57 mg/dL — ABNORMAL HIGH (ref 0.60–1.30)
EGFR (African American): 34 — ABNORMAL LOW
Osmolality: 285 (ref 275–301)
SGPT (ALT): 17 U/L
Sodium: 138 mmol/L (ref 136–145)
Total Protein: 6.9 g/dL (ref 6.4–8.2)

## 2012-03-29 LAB — TROPONIN I: Troponin-I: <0.02 ng/mL

## 2012-03-29 LAB — CBC
HCT: 36.5 % (ref 35.0–47.0)
HGB: 11.6 g/dL — ABNORMAL LOW (ref 12.0–16.0)
MCH: 23.2 pg — ABNORMAL LOW (ref 26.0–34.0)
MCHC: 31.9 g/dL — ABNORMAL LOW (ref 32.0–36.0)
MCV: 73 fL — ABNORMAL LOW (ref 80–100)
Platelet: 199 10*3/uL (ref 150–440)
RBC: 5.02 10*6/uL (ref 3.80–5.20)
RDW: 27.9 % — ABNORMAL HIGH (ref 11.5–14.5)
WBC: 10.9 10*3/uL (ref 3.6–11.0)

## 2012-04-13 ENCOUNTER — Encounter: Payer: Self-pay | Admitting: Cardiovascular Disease

## 2012-04-13 ENCOUNTER — Ambulatory Visit (INDEPENDENT_AMBULATORY_CARE_PROVIDER_SITE_OTHER): Payer: Medicare Other | Admitting: Cardiovascular Disease

## 2012-04-13 VITALS — BP 127/70 | HR 57 | Ht <= 58 in | Wt 176.0 lb

## 2012-04-13 DIAGNOSIS — I2510 Atherosclerotic heart disease of native coronary artery without angina pectoris: Secondary | ICD-10-CM

## 2012-04-13 DIAGNOSIS — I6529 Occlusion and stenosis of unspecified carotid artery: Secondary | ICD-10-CM

## 2012-04-13 DIAGNOSIS — I5022 Chronic systolic (congestive) heart failure: Secondary | ICD-10-CM

## 2012-04-13 DIAGNOSIS — E1159 Type 2 diabetes mellitus with other circulatory complications: Secondary | ICD-10-CM

## 2012-04-13 DIAGNOSIS — I70209 Unspecified atherosclerosis of native arteries of extremities, unspecified extremity: Secondary | ICD-10-CM

## 2012-04-13 DIAGNOSIS — I509 Heart failure, unspecified: Secondary | ICD-10-CM

## 2012-04-13 DIAGNOSIS — I10 Essential (primary) hypertension: Secondary | ICD-10-CM

## 2012-04-13 DIAGNOSIS — I798 Other disorders of arteries, arterioles and capillaries in diseases classified elsewhere: Secondary | ICD-10-CM

## 2012-04-13 DIAGNOSIS — E1151 Type 2 diabetes mellitus with diabetic peripheral angiopathy without gangrene: Secondary | ICD-10-CM

## 2012-04-13 MED ORDER — CARVEDILOL 12.5 MG PO TABS: 12.5000 mg | ORAL_TABLET | Freq: Two times a day (BID) | ORAL | Status: DC

## 2012-04-13 NOTE — Assessment & Plan Note (Signed)
We will hold her amlodipine given continued lower cavity swelling. Change metoprolol to carvedilol. We will closely monitor her blood pressure through the [place] nursing facility. Family will also help [place] monitor the blood pressure.

## 2012-04-13 NOTE — Progress Notes (Signed)
Patient ID: Christine Gonzales, female    DOB: 06-17-1925, 78 y.o.   MRN: [id number]  HPI Comments:  78 year old female admitted to [facility] last month with non-ST elevation myocardial infarction, cardiac catheterization which showed severe three-vessel and left main disease. Ejection fraction was 20-25%. She was transferred to [facility] and was seen by Dr. Cornelius Moras who felt that the patient was not a candidate for coronary artery bypass graft surgery. The patient was treated medically. Also with anemia and required blood transfusion, fluid overload improved with furosemide.   The patient has been doing well since her hospital discharge. She denies any chest pain or significant dyspnea. She is accompanied today by her daughter. There is no orthopnea or PND. Her lower extremity edema has improved though she continues to have some edema. She has not worn her compression hose and several weeks. She does use her counterpulsation therapy daily for chronic venous insufficiency/lymphedema.   She does have some pain in her left shoulder and limited range of motion. This has been ongoing for several weeks. She denies any significant trauma to the left shoulder.  EKG shows sinus bradycardia with rate 53 beats per minute ST and T wave abnormality in lead 1 and aVL   Outpatient Encounter Prescriptions as of 04/13/2012  Medication Sig Dispense Refill  . amLODipine (NORVASC) 5 MG tablet Take 1 tablet (5 mg total) by mouth daily.  30 tablet  11  . aspirin 81 MG tablet Take 81 mg by mouth daily.        Marland Kitchen atorvastatin (LIPITOR) 40 MG tablet Take 40 mg by mouth daily.      . cetaphil (CETAPHIL) cream Apply 1 application topically at bedtime. Apply to legs      . cholecalciferol (VITAMIN D) 1000 UNITS tablet Take 1,000 Units by mouth daily.        . clopidogrel (PLAVIX) 75 MG tablet Take 1 tablet (75 mg total) by mouth daily with breakfast.  30 tablet  11  . docusate sodium (COLACE) 100 MG capsule Take 100 mg by  mouth 2 (two) times daily.      . furosemide (LASIX) 40 MG tablet Take 20 mg by mouth daily.      Marland Kitchen guaifenesin (TUSSIN) 100 MG/5ML syrup Take 100 mg by mouth at bedtime as needed.      Marland Kitchen HYDROcodone-acetaminophen (VICODIN) 5-500 MG per tablet Take 1 tablet by mouth every 6 (six) hours as needed. For pain      . isosorbide mononitrate (IMDUR) 30 MG 24 hr tablet Take 1 tablet (30 mg total) by mouth daily.  30 tablet  11  . letrozole (FEMARA) 2.5 MG tablet Take 2.5 mg by mouth daily.        Marland Kitchen levofloxacin (LEVAQUIN) 250 MG tablet Take 250 mg by mouth daily. Takes 1 tablet by mouth every 24 hours for 7 days.      Marland Kitchen levothyroxine (SYNTHROID, LEVOTHROID) 125 MCG tablet Take 125 mcg by mouth daily.      Marland Kitchen losartan (COZAAR) 100 MG tablet Take 100 mg by mouth daily.      . memantine (NAMENDA) 10 MG tablet Take 10 mg by mouth daily.       . nitroGLYCERIN (NITROSTAT) 0.4 MG SL tablet Place 1 tablet (0.4 mg total) under the tongue every 5 (five) minutes x 3 doses as needed for chest pain.  25 tablet  3  . nystatin cream (MYCOSTATIN) Apply 1 application topically 2 (two) times daily. Apply to  affected areas      . omeprazole (PRILOSEC) 20 MG capsule Take 20 mg by mouth daily.        Marland Kitchen spironolactone (ALDACTONE) 25 MG tablet Take 0.5 tablets (12.5 mg total) by mouth daily.  20 tablet  11  . DISCONTD: metoprolol (LOPRESSOR) 50 MG tablet Take 50 mg by mouth 2 (two) times daily.      . carvedilol (COREG) 12.5 MG tablet Take 1 tablet (12.5 mg total) by mouth 2 (two) times daily.  60 tablet  6  . donepezil (ARICEPT) 5 MG tablet Take 5 mg by mouth at bedtime.        Marland Kitchen escitalopram (LEXAPRO) 20 MG tablet Take 20 mg by mouth daily.         Review of Systems  Constitutional: Negative.   HENT: Negative.   Eyes: Negative.   Respiratory: Negative.   Cardiovascular: Positive for leg swelling.  Gastrointestinal: Negative.   Musculoskeletal: Positive for myalgias and arthralgias.       Discomfort in her left  shoulder  Skin: Negative.   Neurological: Negative.   Hematological: Negative.   Psychiatric/Behavioral: Negative.   All other systems reviewed and are negative.    BP 127/70  Pulse 57  Ht 4\' 10"  (1.473 m)  Wt 176 lb (79.833 kg)  BMI 36.78 kg/m2  Physical Exam  Nursing note and vitals reviewed. Constitutional: She is oriented to person, place, and time. She appears well-developed and well-nourished.  HENT:  Head: Normocephalic.  Nose: Nose normal.  Mouth/Throat: Oropharynx is clear and moist.  Eyes: Conjunctivae are normal. Pupils are equal, round, and reactive to light.  Neck: Normal range of motion. Neck supple. No JVD present.  Cardiovascular: Normal rate, regular rhythm, S1 normal, S2 normal, normal heart sounds and intact distal pulses.  Exam reveals no gallop and no friction rub.   No murmur heard.      Trace to 1+ minimal pitting edema bilaterally  Pulmonary/Chest: Effort normal and breath sounds normal. No respiratory distress. She has no wheezes. She has no rales. She exhibits no tenderness.  Abdominal: Soft. Bowel sounds are normal. She exhibits no distension. There is no tenderness.  Musculoskeletal: Normal range of motion. She exhibits no edema and no tenderness.  Lymphadenopathy:    She has no cervical adenopathy.  Neurological: She is alert and oriented to person, place, and time. Coordination normal.  Skin: Skin is warm and dry. No rash noted. No erythema.  Psychiatric: She has a normal mood and affect. Her behavior is normal. Judgment and thought content normal.         Assessment and Plan

## 2012-04-13 NOTE — Patient Instructions (Addendum)
You are doing well. Please hold amlodipine (to see if swelling improves over the next few weeks) Stop metoprolol Start coreg 12.5 mg twice a day  Please call us if you have new issues that need to be addressed before your next appt.  Your physician wants you to follow-up in: 1 months.

## 2012-04-13 NOTE — Assessment & Plan Note (Signed)
We have suggested she stay on Lipitor 40 mg daily. Repeat carotid check in one year

## 2012-04-13 NOTE — Assessment & Plan Note (Signed)
We have encouraged continued exercise, careful diet management in an effort to lose weight. 

## 2012-04-13 NOTE — Assessment & Plan Note (Signed)
No significant shortness of breath. She appears euvolemic. Weight is down 7 pounds from her last clinic visit. We will change metoprolol to chloride 12.5 mg twice a day. The dose can be decreased for bradycardia. We will continue her on her current dose of Lasix.

## 2012-04-26 ENCOUNTER — Emergency Department: Payer: Self-pay | Admitting: *Deleted

## 2012-04-27 ENCOUNTER — Emergency Department: Payer: Self-pay | Admitting: Emergency Medicine

## 2012-04-28 LAB — CBC WITH DIFFERENTIAL/PLATELET
Basophil #: 0.1 10*3/uL (ref 0.0–0.1)
Eosinophil #: 0.2 10*3/uL (ref 0.0–0.7)
Eosinophil %: 0.8 %
MCH: 23.1 pg — ABNORMAL LOW (ref 26.0–34.0)
MCV: 74 fL — ABNORMAL LOW (ref 80–100)
Neutrophil #: 14.9 10*3/uL — ABNORMAL HIGH (ref 1.4–6.5)
Neutrophil %: 83.7 %
Platelet: 214 10*3/uL (ref 150–440)
WBC: 17.8 10*3/uL — ABNORMAL HIGH (ref 3.6–11.0)

## 2012-04-28 LAB — BASIC METABOLIC PANEL
Anion Gap: 10 (ref 7–16)
BUN: 27 mg/dL — ABNORMAL HIGH (ref 7–18)
Calcium, Total: 8.7 mg/dL (ref 8.5–10.1)
Creatinine: 1.43 mg/dL — ABNORMAL HIGH (ref 0.60–1.30)
EGFR (Non-African Amer.): 33 — ABNORMAL LOW
Glucose: 136 mg/dL — ABNORMAL HIGH (ref 65–99)
Osmolality: 285 (ref 275–301)
Potassium: 4.5 mmol/L (ref 3.5–5.1)
Sodium: 139 mmol/L (ref 136–145)

## 2012-04-28 LAB — URINALYSIS, COMPLETE
Bilirubin,UR: NEGATIVE
Glucose,UR: NEGATIVE mg/dL (ref 0–75)
Leukocyte Esterase: NEGATIVE
Nitrite: NEGATIVE
Ph: 5 (ref 4.5–8.0)
Protein: NEGATIVE
Specific Gravity: 1.01 (ref 1.003–1.030)
Squamous Epithelial: NONE SEEN
WBC UR: 1 /HPF (ref 0–5)

## 2012-04-28 LAB — PROTIME-INR: INR: 1.1

## 2012-04-29 ENCOUNTER — Encounter (HOSPITAL_COMMUNITY): Payer: Self-pay | Admitting: *Deleted

## 2012-05-05 DIAGNOSIS — F0280 Dementia in other diseases classified elsewhere without behavioral disturbance: Secondary | ICD-10-CM

## 2012-05-05 DIAGNOSIS — I5042 Chronic combined systolic (congestive) and diastolic (congestive) heart failure: Secondary | ICD-10-CM

## 2012-05-05 DIAGNOSIS — G309 Alzheimer's disease, unspecified: Secondary | ICD-10-CM

## 2012-05-05 DIAGNOSIS — S065X0A Traumatic subdural hemorrhage without loss of consciousness, initial encounter: Secondary | ICD-10-CM

## 2012-05-05 DIAGNOSIS — F329 Major depressive disorder, single episode, unspecified: Secondary | ICD-10-CM

## 2012-05-06 ENCOUNTER — Emergency Department: Payer: Self-pay | Admitting: Emergency Medicine

## 2012-05-06 ENCOUNTER — Telehealth: Payer: Self-pay | Admitting: Internal Medicine

## 2012-05-06 LAB — CBC
HCT: 30.4 % — ABNORMAL LOW (ref 35.0–47.0)
HGB: 9.5 g/dL — ABNORMAL LOW (ref 12.0–16.0)
MCH: 23.9 pg — ABNORMAL LOW (ref 26.0–34.0)
MCHC: 31.1 g/dL — ABNORMAL LOW (ref 32.0–36.0)
MCV: 77 fL — ABNORMAL LOW (ref 80–100)
Platelet: 334 10*3/uL (ref 150–440)
RBC: 3.96 10*6/uL (ref 3.80–5.20)
RDW: 26.5 % — ABNORMAL HIGH (ref 11.5–14.5)

## 2012-05-06 LAB — COMPREHENSIVE METABOLIC PANEL
Anion Gap: 10 (ref 7–16)
BUN: 30 mg/dL — ABNORMAL HIGH (ref 7–18)
Bilirubin,Total: 0.6 mg/dL (ref 0.2–1.0)
Chloride: 102 mmol/L (ref 98–107)
Co2: 25 mmol/L (ref 21–32)
Creatinine: 1.24 mg/dL (ref 0.60–1.30)
EGFR (African American): 46 — ABNORMAL LOW
EGFR (Non-African Amer.): 40 — ABNORMAL LOW
SGPT (ALT): 20 U/L
Total Protein: 6.8 g/dL (ref 6.4–8.2)

## 2012-05-06 LAB — URINALYSIS, COMPLETE
Bilirubin,UR: NEGATIVE
Blood: NEGATIVE
Leukocyte Esterase: NEGATIVE
Nitrite: NEGATIVE
Ph: 5 (ref 4.5–8.0)
RBC,UR: 1 /HPF (ref 0–5)
Squamous Epithelial: NONE SEEN
WBC UR: 10 /HPF (ref 0–5)

## 2012-05-06 LAB — PROTIME-INR: INR: 1

## 2012-05-06 LAB — TSH: Thyroid Stimulating Horm: 2.84 u[IU]/mL

## 2012-05-06 NOTE — Telephone Encounter (Signed)
Error

## 2012-05-11 ENCOUNTER — Encounter: Payer: Self-pay | Admitting: Internal Medicine

## 2012-05-17 ENCOUNTER — Encounter: Payer: Self-pay | Admitting: Cardiothoracic Surgery

## 2012-05-18 ENCOUNTER — Ambulatory Visit (INDEPENDENT_AMBULATORY_CARE_PROVIDER_SITE_OTHER): Payer: Medicare Other | Admitting: Cardiovascular Disease

## 2012-05-18 ENCOUNTER — Encounter: Payer: Self-pay | Admitting: Cardiovascular Disease

## 2012-05-18 VITALS — BP 133/76 | HR 68 | Ht <= 58 in | Wt 173.0 lb

## 2012-05-18 DIAGNOSIS — I509 Heart failure, unspecified: Secondary | ICD-10-CM

## 2012-05-18 DIAGNOSIS — E1151 Type 2 diabetes mellitus with diabetic peripheral angiopathy without gangrene: Secondary | ICD-10-CM

## 2012-05-18 DIAGNOSIS — I70209 Unspecified atherosclerosis of native arteries of extremities, unspecified extremity: Secondary | ICD-10-CM

## 2012-05-18 DIAGNOSIS — I10 Essential (primary) hypertension: Secondary | ICD-10-CM

## 2012-05-18 DIAGNOSIS — I5021 Acute systolic (congestive) heart failure: Secondary | ICD-10-CM

## 2012-05-18 DIAGNOSIS — I2510 Atherosclerotic heart disease of native coronary artery without angina pectoris: Secondary | ICD-10-CM

## 2012-05-18 DIAGNOSIS — E1159 Type 2 diabetes mellitus with other circulatory complications: Secondary | ICD-10-CM

## 2012-05-18 DIAGNOSIS — I6529 Occlusion and stenosis of unspecified carotid artery: Secondary | ICD-10-CM

## 2012-05-18 DIAGNOSIS — I798 Other disorders of arteries, arterioles and capillaries in diseases classified elsewhere: Secondary | ICD-10-CM

## 2012-05-18 DIAGNOSIS — I5022 Chronic systolic (congestive) heart failure: Secondary | ICD-10-CM

## 2012-05-18 NOTE — Progress Notes (Signed)
Patient ID: Christine Gonzales [facility], female    DOB: 28-Nov-1924, 78 y.o.   MRN: [id number]  HPI Comments:  78 year old female admitted to [facility] last month with non-ST elevation myocardial infarction, cardiac catheterization which showed severe three-vessel and left main disease. Ejection fraction was 20-25%. She was transferred to [facility] and was seen by Dr. Cornelius Moras who felt that the patient was not a candidate for coronary artery bypass graft surgery. The patient was treated medically. Also with anemia and required blood transfusion, fluid overload improved with furosemide.  On her last clinic visit, she was doing well. Since then, she suffered a mechanical fall. Around that time she was having numerous falls. She presented to [facility] with a large hematoma on the back of her head. She was discharged to [facility] [place] for rehabilitation and within 2 days, suffered a stroke to the left cerebellar region, 2.8 x 5 cm in size with balance and strength deficits. 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 Her Plavix was held, she was continued on aspirin 325 mg daily, Lipitor was increased, losartan was continued.  Since her hematoma, it has since progressed now with necrotic tissue, a split in the skin. She has seen the wound center and has referrals to local surgeons for possible debridement. She is staying at [facility]. She is working with physical therapy, still is not walking or working on her strength.  She denies any significant chest pain or shortness of breath. She has been essentially wheelchair-bound.  EKG shows sinus bradycardia with rate 69 beats per minute, nonspecific ST and T wave abnormality in lead V6, 1 and aVL   Outpatient Encounter Prescriptions as of 05/18/2012  Medication Sig Dispense Refill  . acetaminophen (TYLENOL) 325 MG tablet Take 650 mg by mouth every 6 (six) hours as needed.      Marland Kitchen acetaminophen (TYLENOL) 500 MG tablet Take 500 mg by mouth every 6 (six) hours as  needed.      Marland Kitchen aspirin 325 MG tablet Take 325 mg by mouth daily.      Marland Kitchen atorvastatin (LIPITOR) 20 MG tablet Take three tablets everyday at bedtime.      . cetaphil (CETAPHIL) cream Apply 1 application topically at bedtime. Apply to legs      . cholecalciferol (VITAMIN D) 1000 UNITS tablet Take 1,000 Units by mouth daily.        Marland Kitchen docusate sodium (COLACE) 100 MG capsule Take 100 mg by mouth 2 (two) times daily.      Marland Kitchen donepezil (ARICEPT) 10 MG tablet Take 10 mg by mouth at bedtime.      Marland Kitchen escitalopram (LEXAPRO) 10 MG tablet Take 10 mg by mouth daily.      Marland Kitchen HYDROcodone-acetaminophen (VICODIN) 5-500 MG per tablet Take 1 tablet by mouth every 6 (six) hours as needed. For pain      . hydrocortisone (ANUSOL-HC) 25 MG suppository Place 25 mg rectally 2 (two) times daily.      Marland Kitchen letrozole (FEMARA) 2.5 MG tablet Take 2.5 mg by mouth daily.        Marland Kitchen levofloxacin (LEVAQUIN) 250 MG tablet Take 250 mg by mouth daily. Takes 1 tablet by mouth every 24 hours for 7 days.      Marland Kitchen levothyroxine (SYNTHROID, LEVOTHROID) 125 MCG tablet Take 125 mcg by mouth daily.      Marland Kitchen losartan (COZAAR) 100 MG tablet Take 100 mg by mouth daily.      . memantine (NAMENDA) 10 MG tablet Take 10  mg by mouth daily.       . nitroGLYCERIN (NITROSTAT) 0.4 MG SL tablet Place 1 tablet (0.4 mg total) under the tongue every 5 (five) minutes x 3 doses as needed for chest pain.  25 tablet  3  . nystatin 100000 UNITS vaginal tablet Place 1 tablet vaginally at bedtime.      Marland Kitchen nystatin cream (MYCOSTATIN) Apply 1 application topically 2 (two) times daily. Apply to affected areas      . omeprazole (PRILOSEC) 20 MG capsule Take 20 mg by mouth daily.        Marland Kitchen spironolactone (ALDACTONE) 25 MG tablet Take 0.5 tablets (12.5 mg total) by mouth daily.  20 tablet  11  . vitamin C (ASCORBIC ACID) 500 MG tablet Take 500 mg by mouth daily.       Review of Systems  Constitutional: Negative.   HENT: Negative.   Eyes: Negative.   Respiratory: Negative.     Gastrointestinal: Negative.   Musculoskeletal: Positive for gait problem.       Discomfort in her left shoulder  Skin: Negative.        Large hematoma on the posterior aspect of her head  Neurological: Negative.   Hematological: Negative.   Psychiatric/Behavioral: Negative.   All other systems reviewed and are negative.    BP 133/76  Pulse 68  Ht 4\' 10"  (1.473 m)  Wt 173 lb (78.472 kg)  BMI 36.16 kg/m2  Physical Exam  Nursing note and vitals reviewed. Constitutional: She is oriented to person, place, and time. She appears well-developed and well-nourished.       Head is wrapped in gauze, bandage in place in the posterior aspect of her head  HENT:  Head: Normocephalic.  Nose: Nose normal.  Mouth/Throat: Oropharynx is clear and moist.  Eyes: Conjunctivae are normal. Pupils are equal, round, and reactive to light.  Neck: Normal range of motion. Neck supple. No JVD present.  Cardiovascular: Normal rate, regular rhythm, S1 normal, S2 normal, normal heart sounds and intact distal pulses.  Exam reveals no gallop and no friction rub.   No murmur heard.      Trace to 1+ minimal pitting edema bilaterally around the ankles  Pulmonary/Chest: Effort normal and breath sounds normal. No respiratory distress. She has no wheezes. She has no rales. She exhibits no tenderness.  Abdominal: Soft. Bowel sounds are normal. She exhibits no distension. There is no tenderness.  Musculoskeletal: Normal range of motion. She exhibits no edema and no tenderness.  Lymphadenopathy:    She has no cervical adenopathy.  Neurological: She is alert and oriented to person, place, and time. Coordination normal.  Skin: Skin is warm and dry. No rash noted. No erythema.  Psychiatric: She has a normal mood and affect. Her behavior is normal. Judgment and thought content normal.         Assessment and Plan

## 2012-05-18 NOTE — Assessment & Plan Note (Signed)
Reason stroke, possibly embolic from carotid. Continue aggressive medical management. High-dose aspirin and statin.

## 2012-05-18 NOTE — Assessment & Plan Note (Signed)
Blood pressure is well controlled on today's visit. No changes made to the medications. We will not restart beta blocker as it is uncertain if bradycardia was contributing to her falls last month. Currently heart rate is adequate.

## 2012-05-18 NOTE — Assessment & Plan Note (Signed)
We have encouraged continued careful diet management in an effort to lose weight.  

## 2012-05-18 NOTE — Assessment & Plan Note (Signed)
Currently with no symptoms of angina. No further workup at this time. Continue current medication regimen. Not a candidate for bypass.

## 2012-05-18 NOTE — Assessment & Plan Note (Signed)
Clinically, does not appear to have heart failure. Again we will not start some of her medications given frequent falls last month leading to hematoma. We'll hold off on restarting beta blockers.

## 2012-05-18 NOTE — Patient Instructions (Addendum)
You are doing well. No medication changes were made.  Please wear TED hose/compression hose when legs are down as tolerated.  Please call us if you have new issues that need to be addressed before your next appt.  Your physician wants you to follow-up in: 6 months.  You will receive a reminder letter in the mail two months in advance. If you don't receive a letter, please call our office to schedule the follow-up appointment.

## 2012-05-20 ENCOUNTER — Inpatient Hospital Stay: Payer: Self-pay | Admitting: Surgery

## 2012-05-20 DIAGNOSIS — I2510 Atherosclerotic heart disease of native coronary artery without angina pectoris: Secondary | ICD-10-CM

## 2012-05-20 DIAGNOSIS — Z01810 Encounter for preprocedural cardiovascular examination: Secondary | ICD-10-CM

## 2012-05-20 LAB — CBC WITH DIFFERENTIAL/PLATELET
Basophil %: 1.1 %
HCT: 30.5 % — ABNORMAL LOW (ref 35.0–47.0)
Lymphocyte %: 16.3 %
MCHC: 30.9 g/dL — ABNORMAL LOW (ref 32.0–36.0)
MCV: 78 fL — ABNORMAL LOW (ref 80–100)
Monocyte %: 7.9 %
Platelet: 326 10*3/uL (ref 150–440)
WBC: 9.5 10*3/uL (ref 3.6–11.0)

## 2012-05-20 LAB — BASIC METABOLIC PANEL
Anion Gap: 6 — ABNORMAL LOW (ref 7–16)
BUN: 17 mg/dL (ref 7–18)
Calcium, Total: 8.8 mg/dL (ref 8.5–10.1)
Chloride: 107 mmol/L (ref 98–107)
EGFR (Non-African Amer.): 38 — ABNORMAL LOW
Glucose: 138 mg/dL — ABNORMAL HIGH (ref 65–99)
Osmolality: 285 (ref 275–301)
Potassium: 4.3 mmol/L (ref 3.5–5.1)
Sodium: 141 mmol/L (ref 136–145)

## 2012-05-23 ENCOUNTER — Encounter: Payer: Self-pay | Admitting: Internal Medicine

## 2012-05-27 ENCOUNTER — Encounter: Payer: Self-pay | Admitting: Nurse Practitioner

## 2012-06-02 ENCOUNTER — Emergency Department: Payer: Self-pay | Admitting: *Deleted

## 2012-06-07 ENCOUNTER — Encounter: Payer: Self-pay | Admitting: Cardiothoracic Surgery

## 2012-07-04 ENCOUNTER — Inpatient Hospital Stay: Payer: Self-pay | Admitting: Internal Medicine

## 2012-07-04 DIAGNOSIS — R748 Abnormal levels of other serum enzymes: Secondary | ICD-10-CM

## 2012-07-04 LAB — CBC
HCT: 31.4 % — ABNORMAL LOW (ref 35.0–47.0)
HGB: 9.9 g/dL — ABNORMAL LOW (ref 12.0–16.0)
MCHC: 31.6 g/dL — ABNORMAL LOW (ref 32.0–36.0)
MCV: 75 fL — ABNORMAL LOW (ref 80–100)
Platelet: 261 10*3/uL (ref 150–440)
RDW: 16.6 % — ABNORMAL HIGH (ref 11.5–14.5)
WBC: 10.5 10*3/uL (ref 3.6–11.0)

## 2012-07-04 LAB — URINALYSIS, COMPLETE
Bilirubin,UR: NEGATIVE
Blood: NEGATIVE
Glucose,UR: NEGATIVE mg/dL (ref 0–75)
Ketone: NEGATIVE
Nitrite: NEGATIVE
RBC,UR: <1 /HPF (ref 0–5)
Specific Gravity: 1.005 (ref 1.003–1.030)
Squamous Epithelial: NONE SEEN
WBC UR: 10 /HPF (ref 0–5)

## 2012-07-04 LAB — BASIC METABOLIC PANEL
BUN: 14 mg/dL (ref 7–18)
Calcium, Total: 8.5 mg/dL (ref 8.5–10.1)
Chloride: 109 mmol/L — ABNORMAL HIGH (ref 98–107)
EGFR (Non-African Amer.): 41 — ABNORMAL LOW
Osmolality: 289 (ref 275–301)
Potassium: 3.7 mmol/L (ref 3.5–5.1)
Sodium: 143 mmol/L (ref 136–145)

## 2012-07-04 LAB — HEMOGLOBIN A1C: Hemoglobin A1C: 6.4 % — ABNORMAL HIGH (ref 4.2–6.3)

## 2012-07-04 LAB — TROPONIN I: Troponin-I: 0.07 ng/mL — ABNORMAL HIGH

## 2012-07-05 ENCOUNTER — Ambulatory Visit: Payer: Self-pay | Admitting: Internal Medicine

## 2012-07-05 DIAGNOSIS — I5021 Acute systolic (congestive) heart failure: Secondary | ICD-10-CM

## 2012-07-05 LAB — CBC WITH DIFFERENTIAL/PLATELET
Basophil #: 0.1 10*3/uL (ref 0.0–0.1)
Basophil %: 0.9 %
Eosinophil #: 0.3 10*3/uL (ref 0.0–0.7)
Eosinophil %: 2.9 %
HCT: 30.9 % — ABNORMAL LOW (ref 35.0–47.0)
Lymphocyte #: 1.3 10*3/uL (ref 1.0–3.6)
Lymphocyte %: 13.9 %
MCH: 23.5 pg — ABNORMAL LOW (ref 26.0–34.0)
MCHC: 31.1 g/dL — ABNORMAL LOW (ref 32.0–36.0)
MCV: 76 fL — ABNORMAL LOW (ref 80–100)
Neutrophil %: 71.8 %
Platelet: 273 10*3/uL (ref 150–440)
RDW: 16.4 % — ABNORMAL HIGH (ref 11.5–14.5)
WBC: 9.5 10*3/uL (ref 3.6–11.0)

## 2012-07-06 LAB — BASIC METABOLIC PANEL
Chloride: 105 mmol/L (ref 98–107)
Glucose: 86 mg/dL (ref 65–99)
Osmolality: 286 (ref 275–301)
Potassium: 3.7 mmol/L (ref 3.5–5.1)
Sodium: 143 mmol/L (ref 136–145)

## 2012-07-06 LAB — CREATININE, SERUM
Creatinine: 1.34 mg/dL — ABNORMAL HIGH (ref 0.60–1.30)
EGFR (African American): 42 — ABNORMAL LOW
EGFR (Non-African Amer.): 36 — ABNORMAL LOW

## 2012-07-07 LAB — MAGNESIUM: Magnesium: 1.3 mg/dL — ABNORMAL LOW

## 2012-07-07 LAB — BASIC METABOLIC PANEL
Anion Gap: 8 (ref 7–16)
BUN: 21 mg/dL — ABNORMAL HIGH (ref 7–18)
Chloride: 103 mmol/L (ref 98–107)
Creatinine: 1.52 mg/dL — ABNORMAL HIGH (ref 0.60–1.30)
EGFR (Non-African Amer.): 31 — ABNORMAL LOW
Glucose: 77 mg/dL (ref 65–99)
Osmolality: 287 (ref 275–301)
Potassium: 3.6 mmol/L (ref 3.5–5.1)
Sodium: 143 mmol/L (ref 136–145)

## 2012-07-09 LAB — CULTURE, BLOOD (SINGLE)

## 2012-07-19 ENCOUNTER — Emergency Department: Payer: Self-pay | Admitting: Emergency Medicine

## 2012-07-24 ENCOUNTER — Ambulatory Visit: Payer: Self-pay | Admitting: Internal Medicine

## 2012-07-27 ENCOUNTER — Encounter: Payer: Self-pay | Admitting: Cardiovascular Disease

## 2012-08-02 ENCOUNTER — Encounter: Payer: Medicare Other | Admitting: Cardiovascular Disease

## 2012-08-11 ENCOUNTER — Emergency Department: Payer: Self-pay | Admitting: *Deleted

## 2012-08-11 LAB — URINALYSIS, COMPLETE
Bacteria: NONE SEEN
Blood: NEGATIVE
Ketone: NEGATIVE
Nitrite: NEGATIVE
Ph: 5 (ref 4.5–8.0)
RBC,UR: 1 /HPF (ref 0–5)
Specific Gravity: 1.016 (ref 1.003–1.030)
Squamous Epithelial: <1

## 2012-08-12 LAB — URINE CULTURE

## 2012-08-25 ENCOUNTER — Encounter: Payer: Self-pay | Admitting: Cardiovascular Disease

## 2012-10-10 ENCOUNTER — Encounter: Payer: Medicare Other | Admitting: Cardiovascular Disease

## 2012-10-23 DEATH — deceased

## 2013-05-10 IMAGING — CR RIGHT ELBOW - COMPLETE 3+ VIEW
1 series · 4 of 4 positions shown · non-contrast
Comparison: none

REASON FOR EXAM: FALL, R SKIN TEAR
COMMENTS:   May transport without cardiac monitor

PROCEDURE:     DXR - DXR ELBOW RT COMP W/OBLIQUES  - July 20, 2012 [DATE]
RESULT:

[Series 1: lat · 0.17mm/px · 4 of 4 slices shown]
[im 1/4]
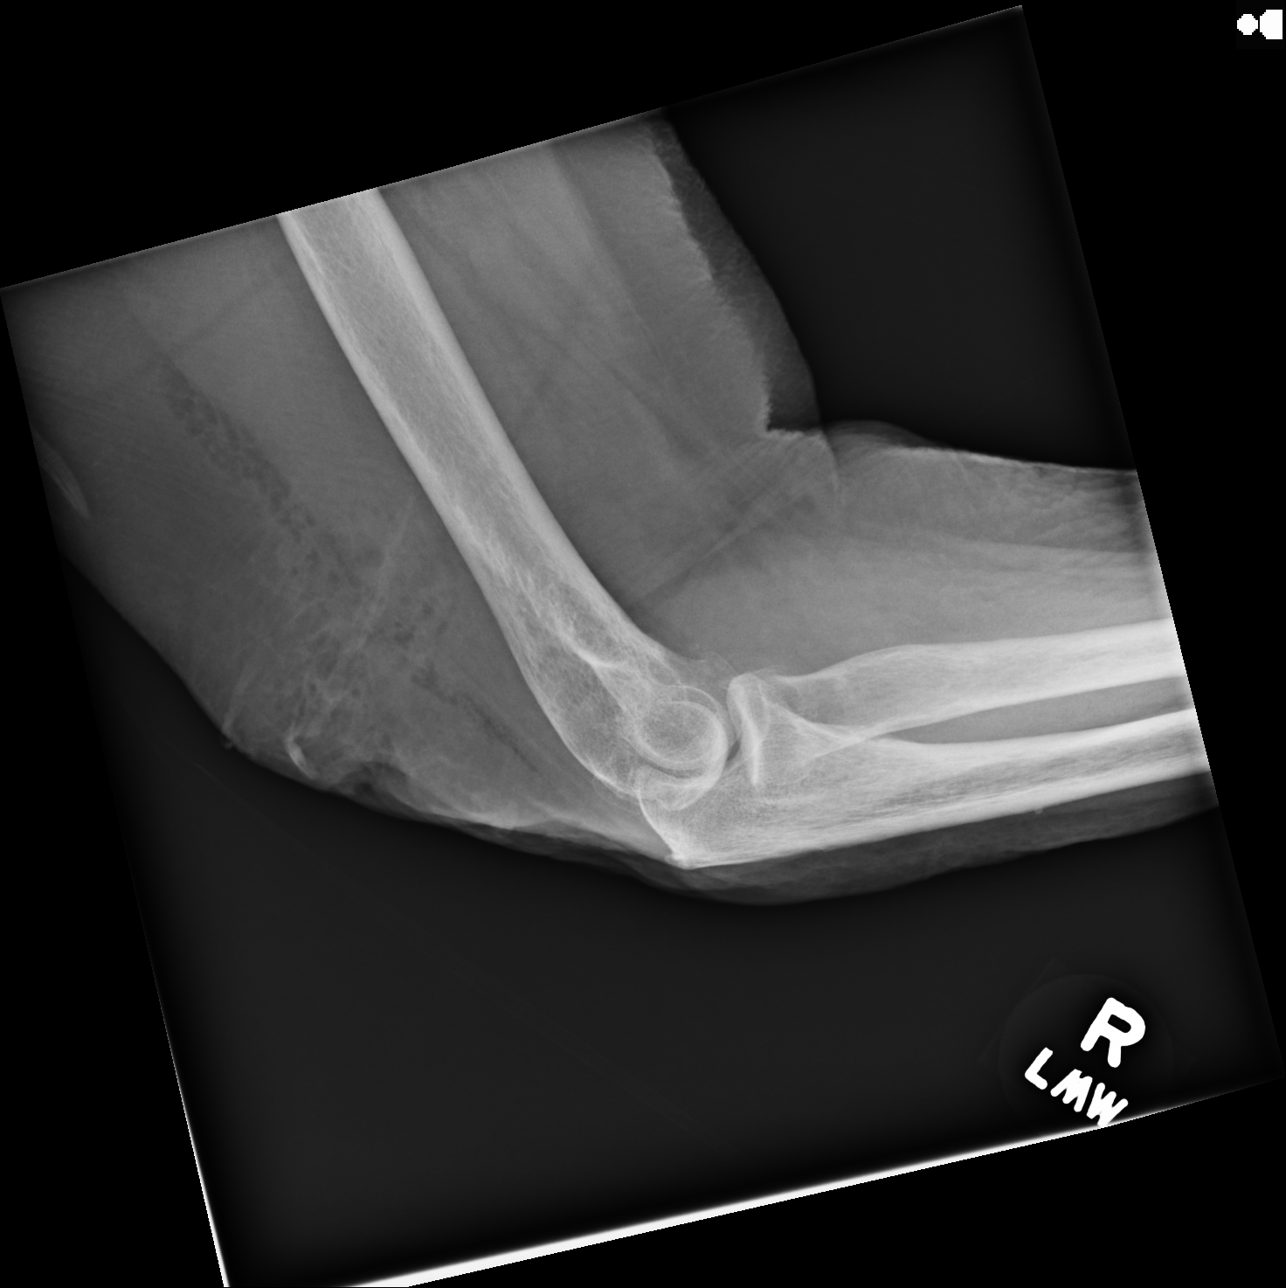
[im 2/4]
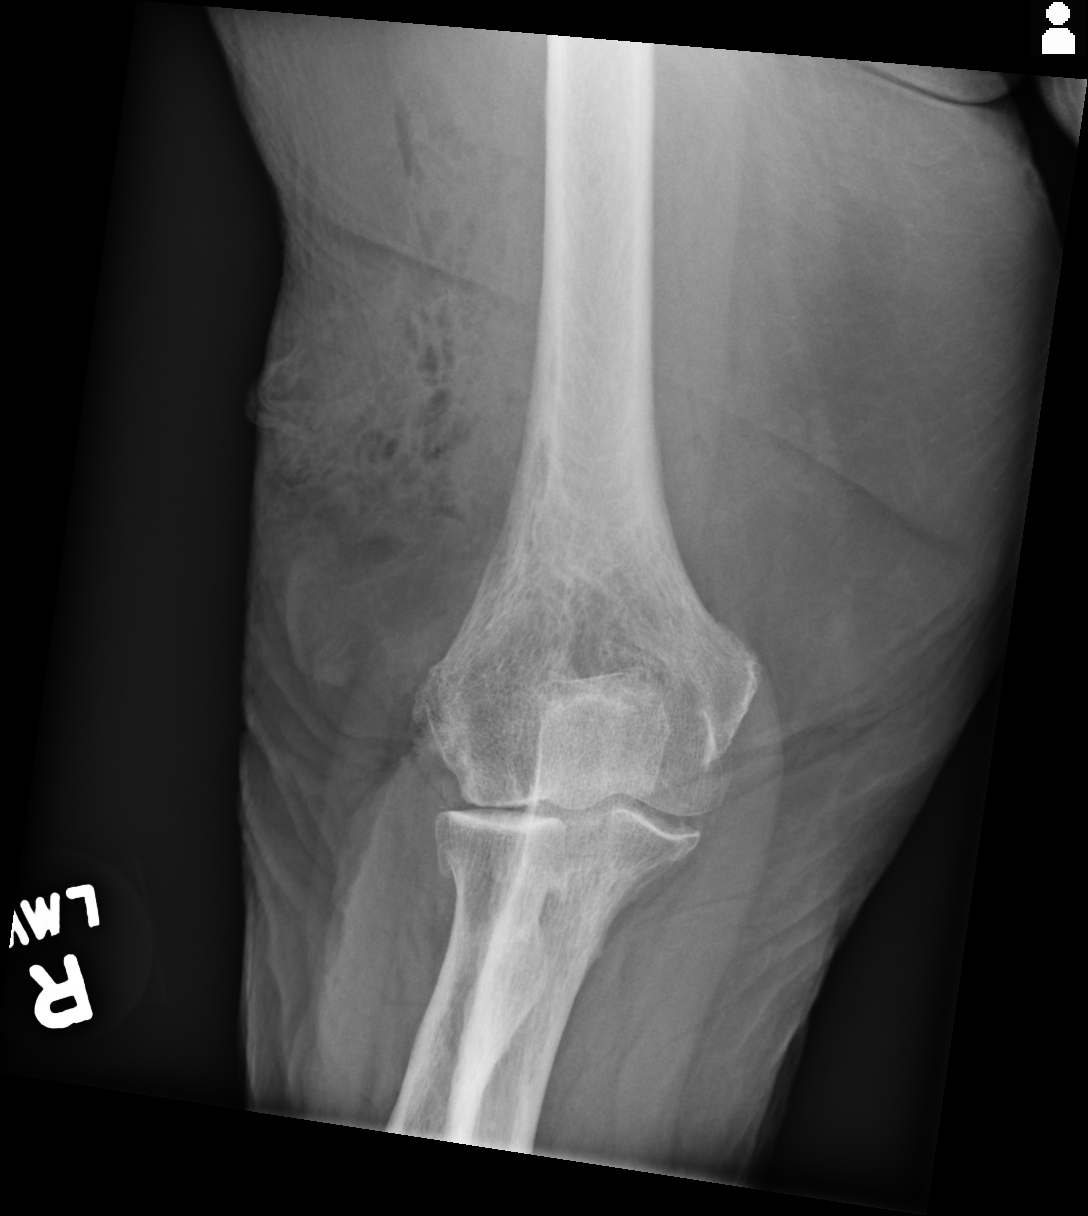
[im 3/4]
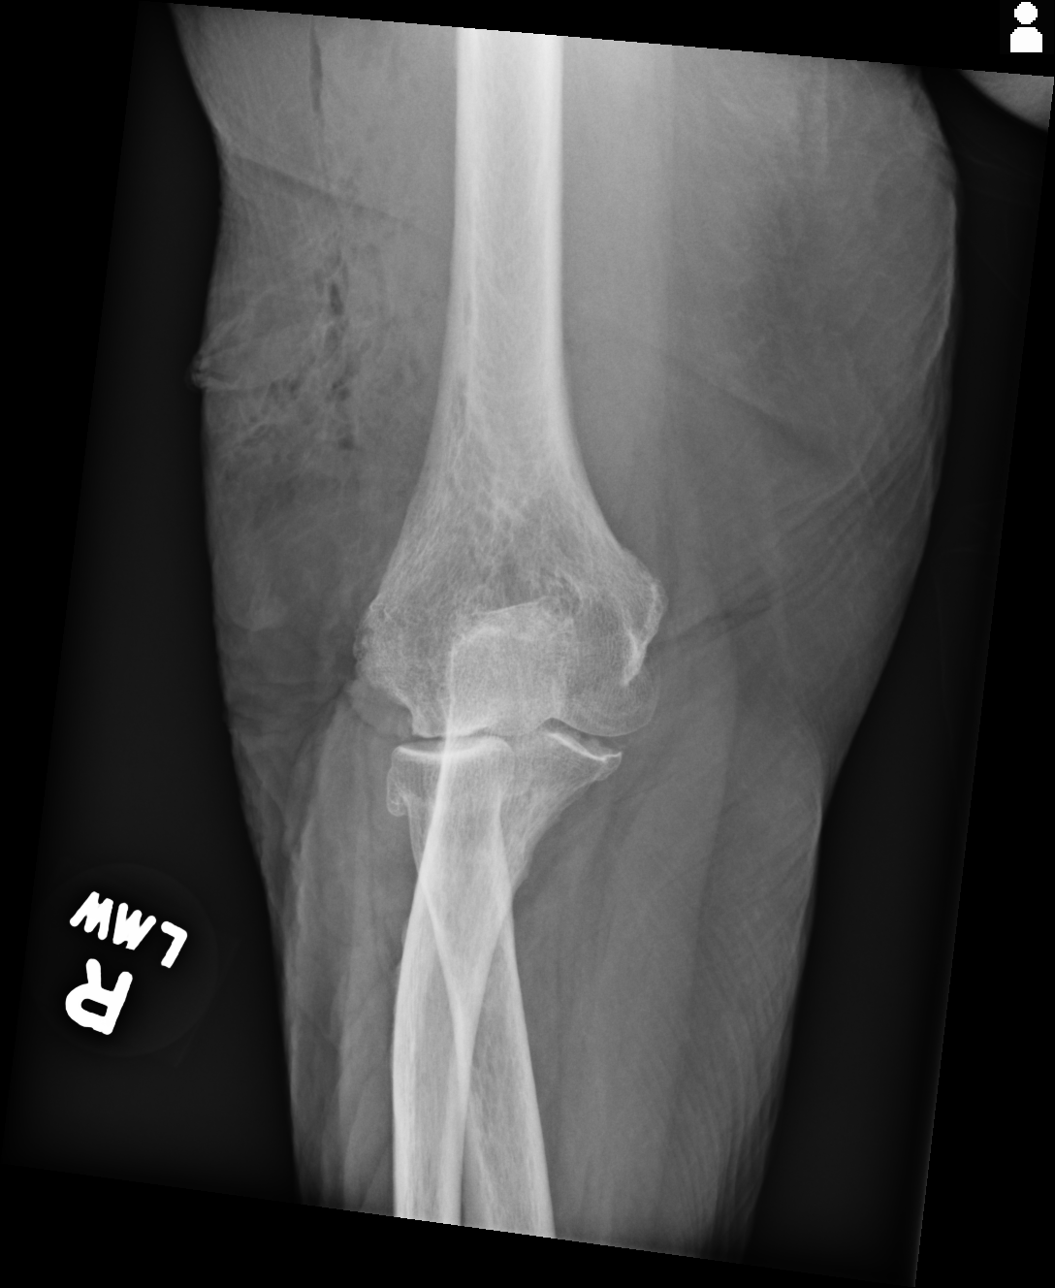
[im 4/4]
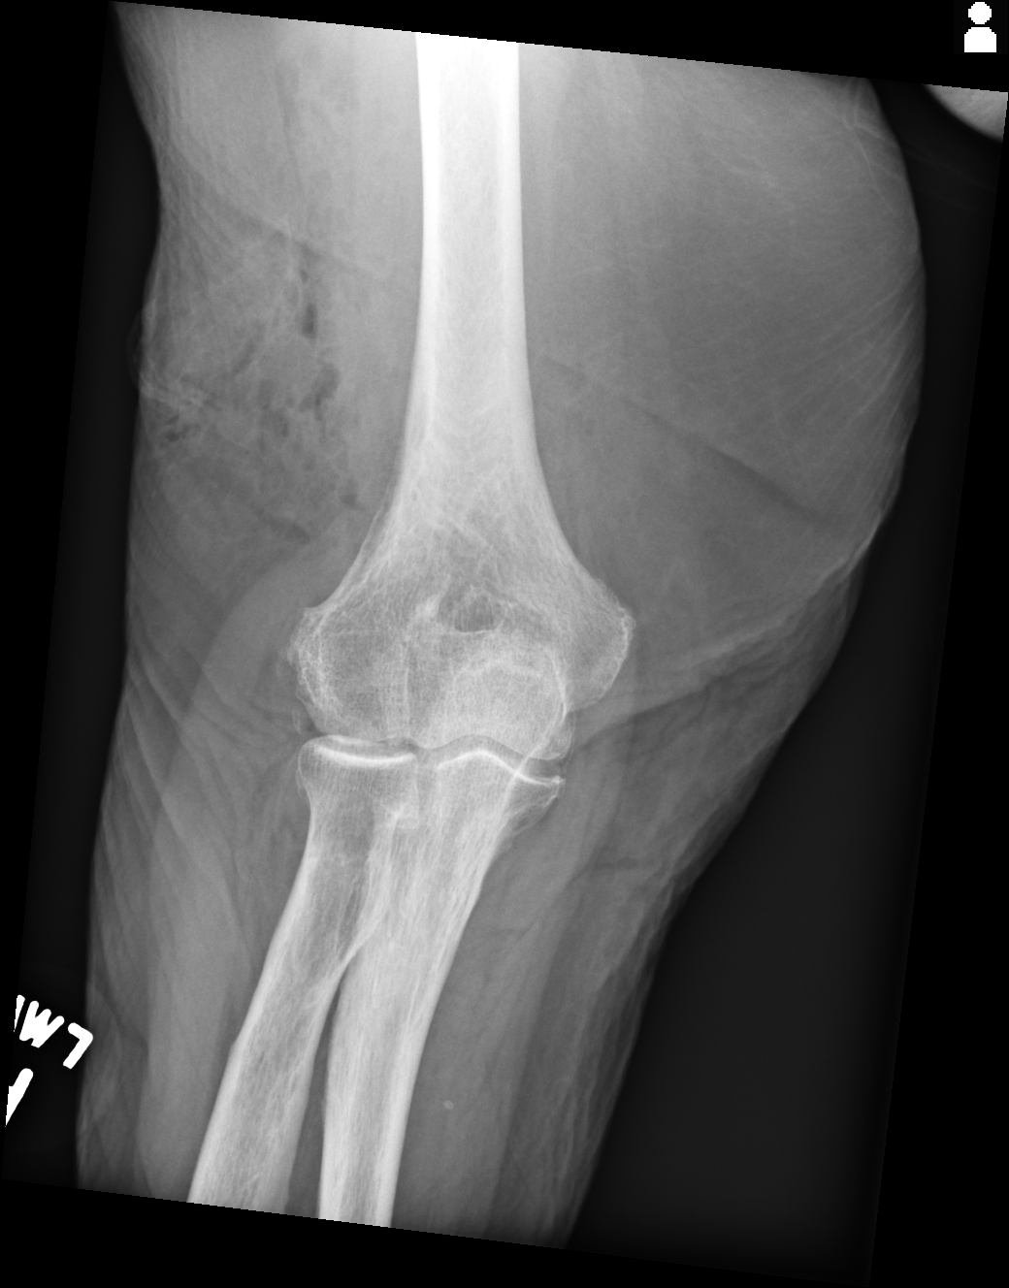

[4 of 4 positions shown; findings below may reference images not displayed]

FINDINGS: There are areas of osteophytosis and subchondral sclerosis
involving the ulna, radius and distal humerus. There is no evidence of acute
fracture or dislocation. Skin lacerations are identified along the posterior
aspect of the elbow.
IMPRESSION: No evidence of acute osseous abnormalities.

## 2013-05-11 IMAGING — CT CT HEAD WITHOUT CONTRAST
2 series · 15 of 30 positions shown, 19 images · non-contrast
Comparison: none

REASON FOR EXAM: FALL, R PARIETAL HEMATOMA ? OLD
COMMENTS:   May transport without cardiac monitor

PROCEDURE:     CT  - CT HEAD WITHOUT CONTRAST  - July 20, 2012 [DATE]
RESULT:
TECHNIQUE: Helical noncontrast 5 mm sections were obtained from the skull
base to the vertex.
Comparison is made to a previous study dated 06/02/2012.

[Series 2: without · axial · non-contrast · 0.42mm/px · z∈[+1204,+1324]mm · 13 of 30 slices shown, 17 images]
[im 3/30  brain]
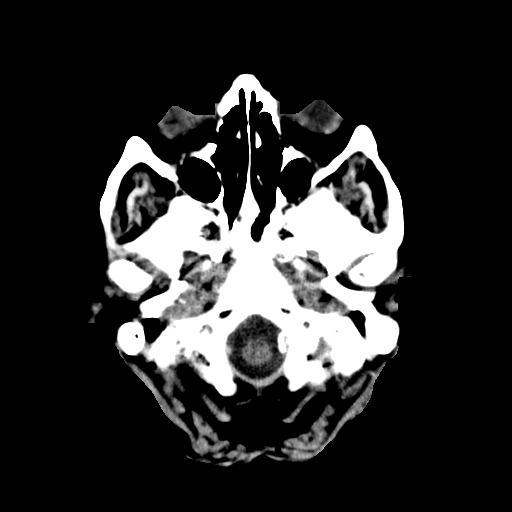
[im 3/30  bone]
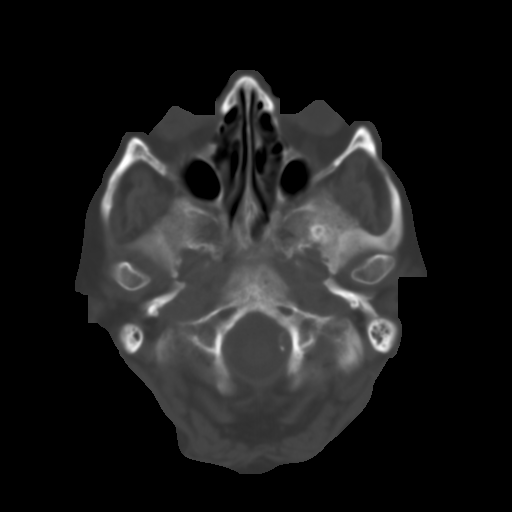
[im 5/30  brain]
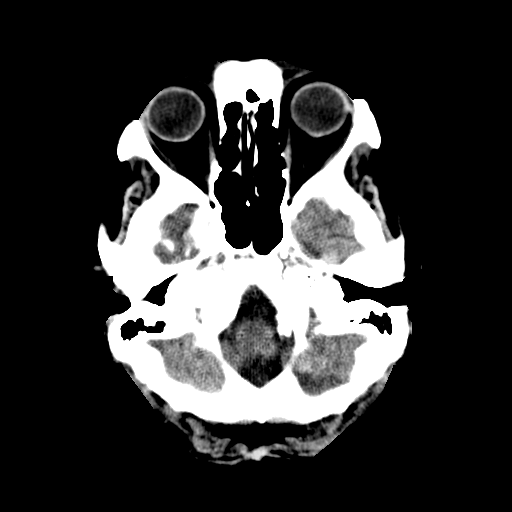
[im 7/30  brain]
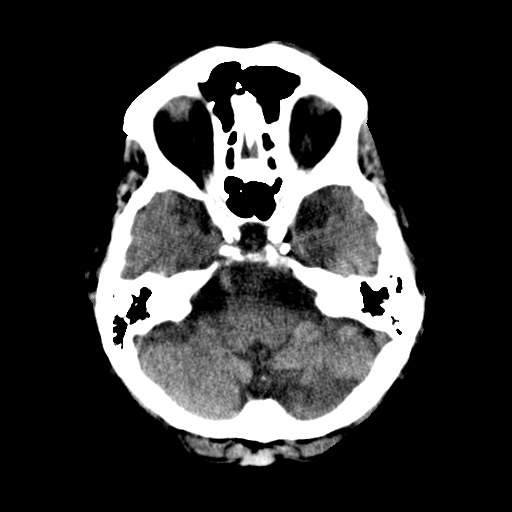
[im 9/30  brain]
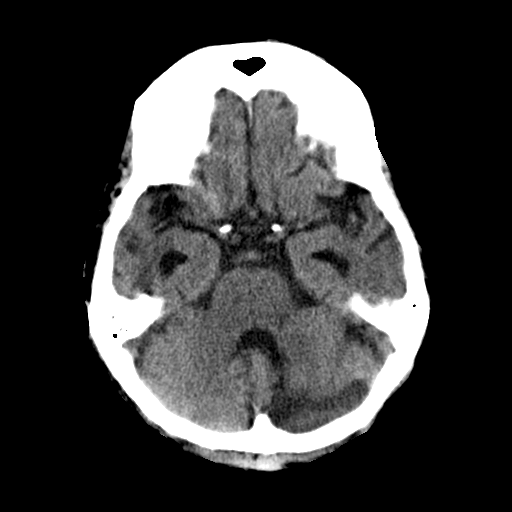
[im 11/30  brain]
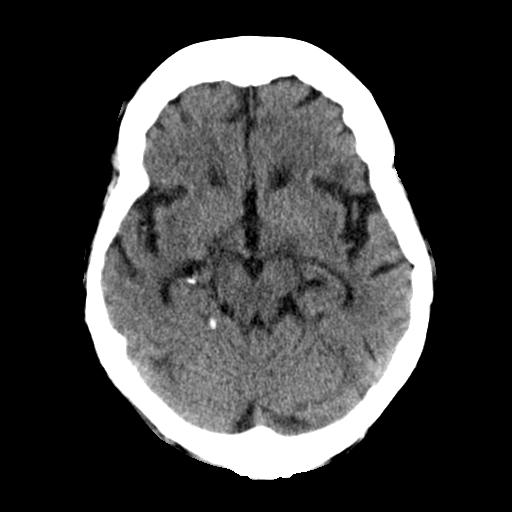
[im 11/30  bone]
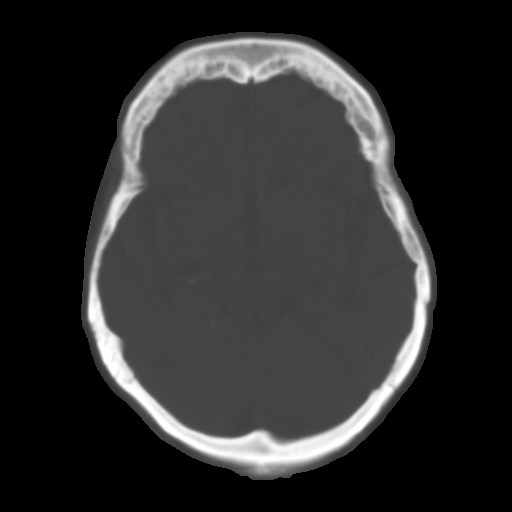
[im 13/30  brain]
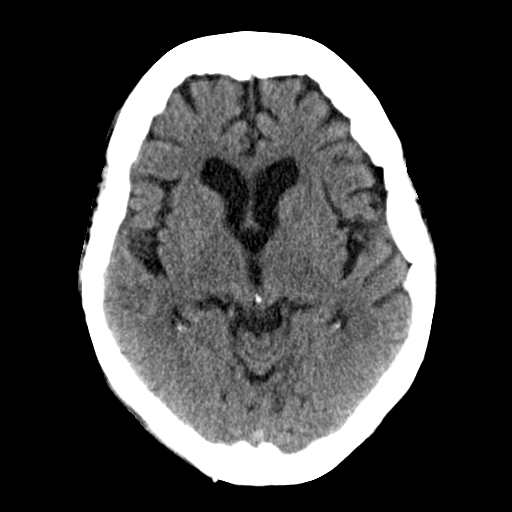
[im 15/30  brain]
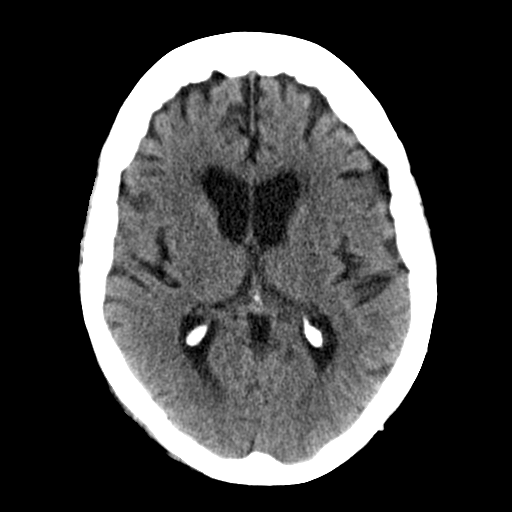
[im 17/30  brain]
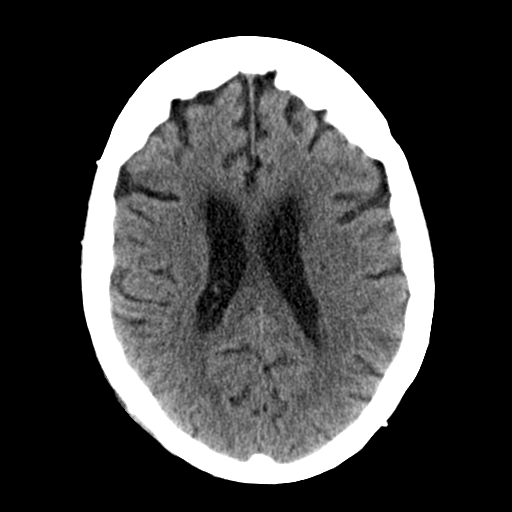
[im 19/30  brain]
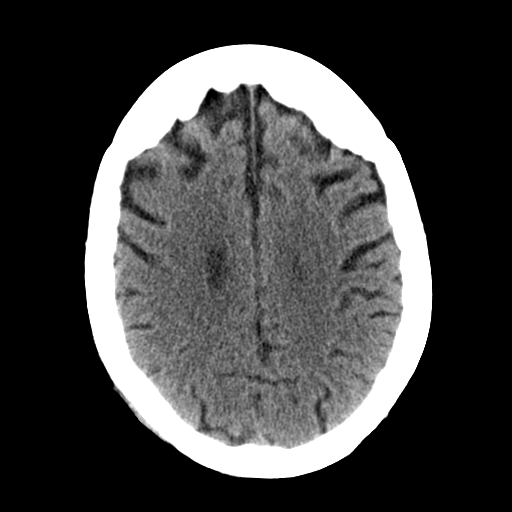
[im 19/30  bone]
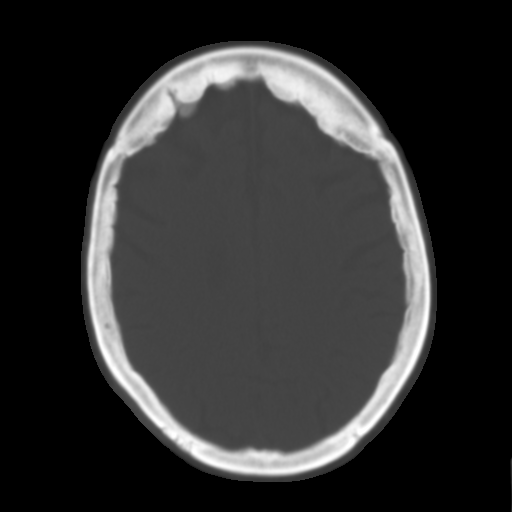
[im 21/30  brain]
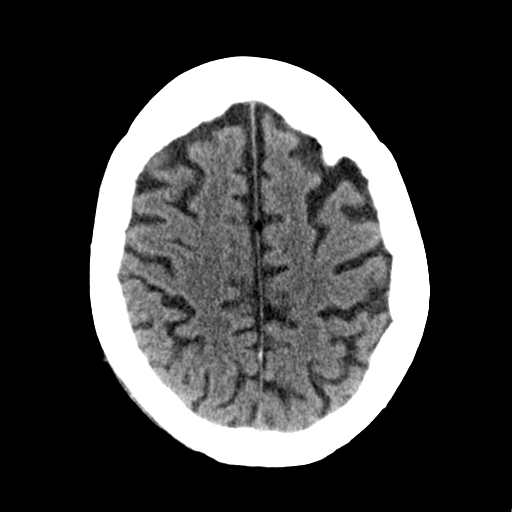
[im 23/30  brain]
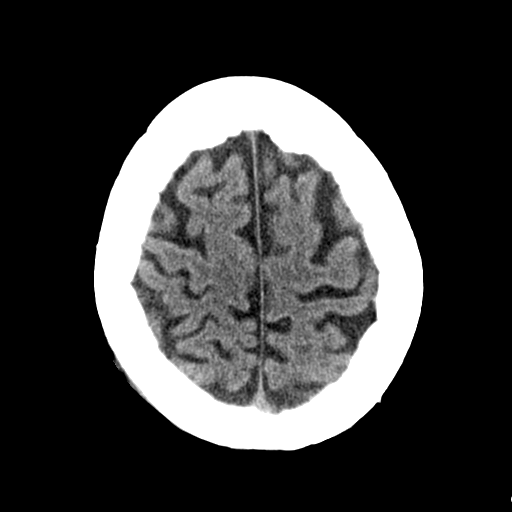
[im 25/30  brain]
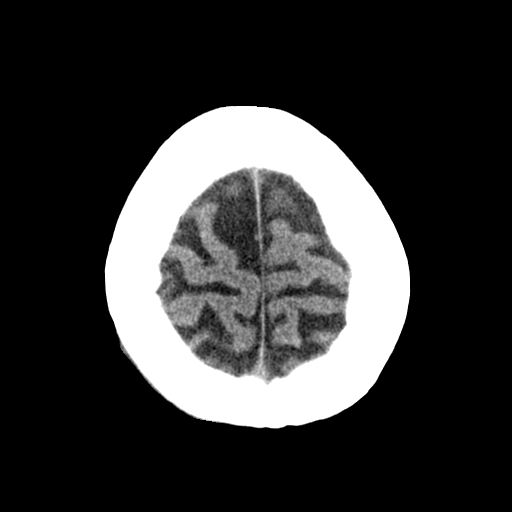
[im 27/30  brain]
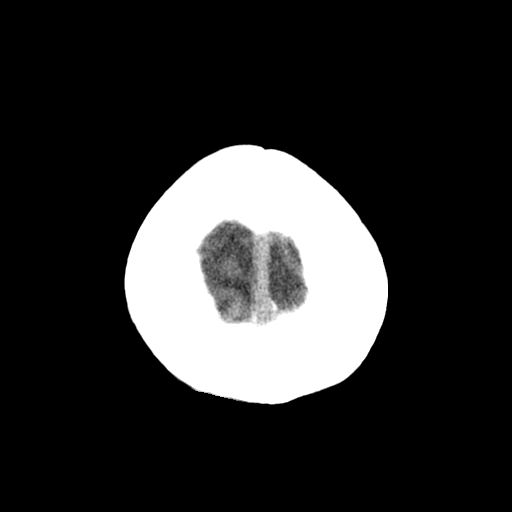
[im 27/30  bone]
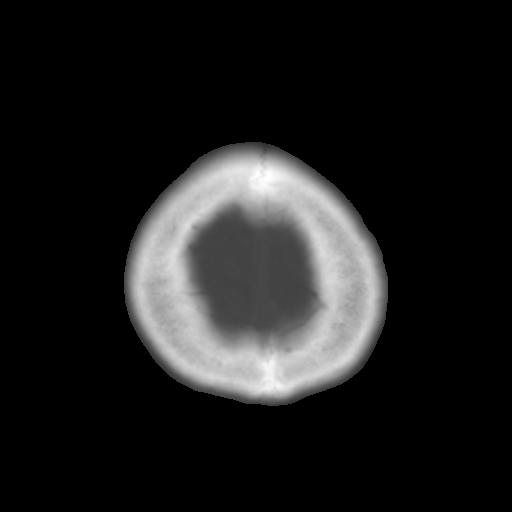

[Series 3: bone · axial · 0.42mm/px · z∈[+1204,+1224]mm · 2 of 30 slices shown]
[im 3/30  bone]
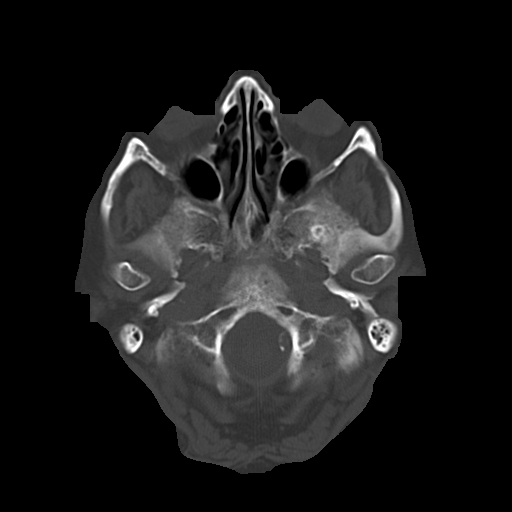
[im 7/30  bone]
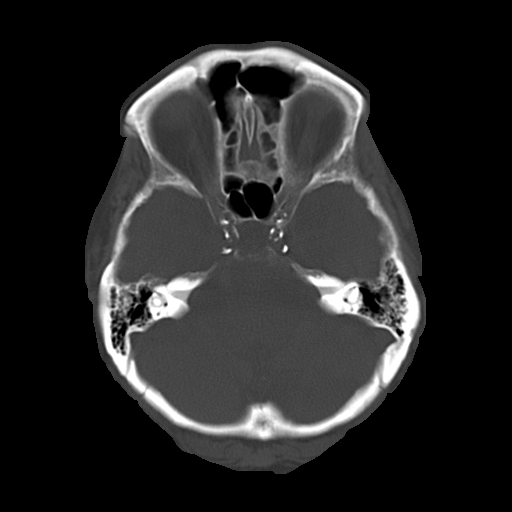

[15 of 30 positions shown; findings below may reference images not displayed]

FINDINGS: There is diffuse cortical atrophy as well as diffuse areas of low
attenuation within the subcortical, deep, and periventricular white matter
regions. An area of low attenuation projects along the posterior aspect of
the left cerebellar hemisphere. There is no evidence of midline shift,
intra-axial or extra-axial fluid collections, or acute hemorrhage. There is
no evidence of a depressed skull fracture.
IMPRESSION: 1.  Chronic and involutional changes without evidence of acute abnormalities.
2.  Dr. Zakary of the Emergency Department was informed of these findings via
a preliminary faxed report.

## 2013-06-02 IMAGING — CR DG LUMBAR SPINE AP/LAT/OBLIQUES W/ FLEX AND EXT
1 series · 5 of 5 positions shown · non-contrast
Comparison: none

REASON FOR EXAM: trauma
COMMENTS:

[Series 1: t lumbar spine ap · 0.14mm/px · 5 of 5 slices shown]
[im 1/5]
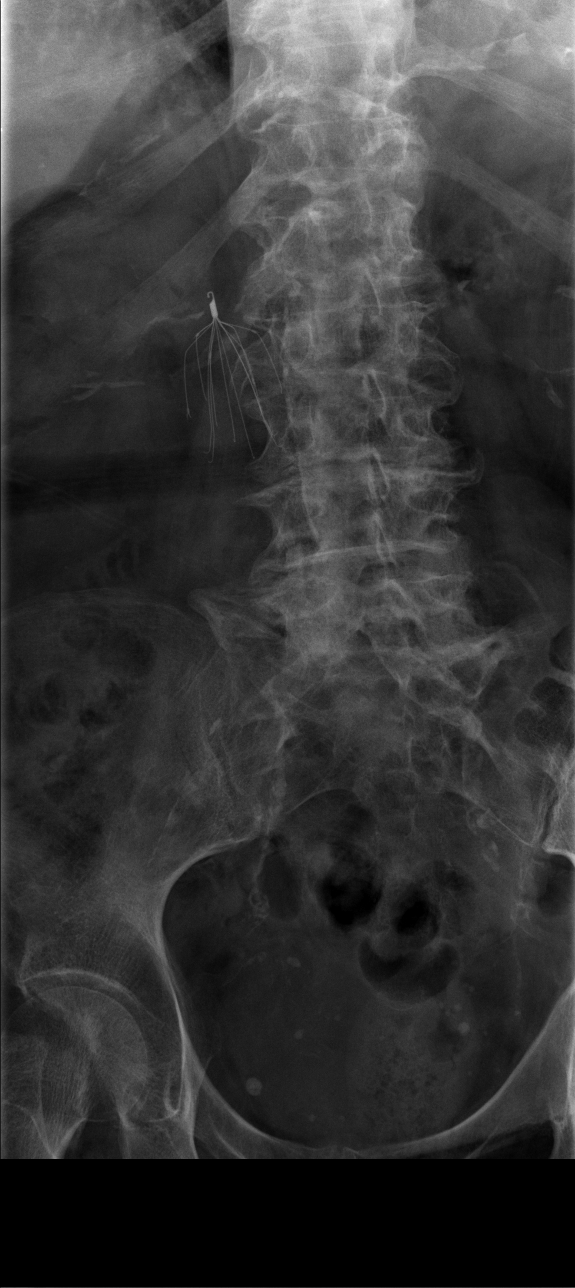
[im 2/5]
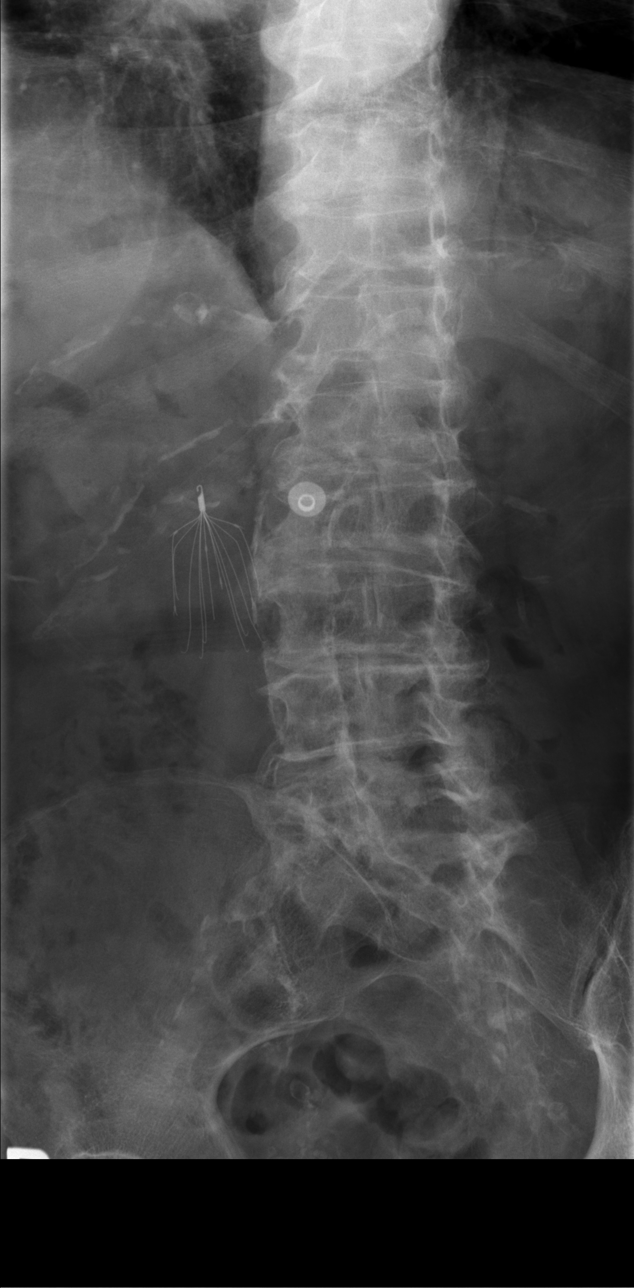
[im 3/5]
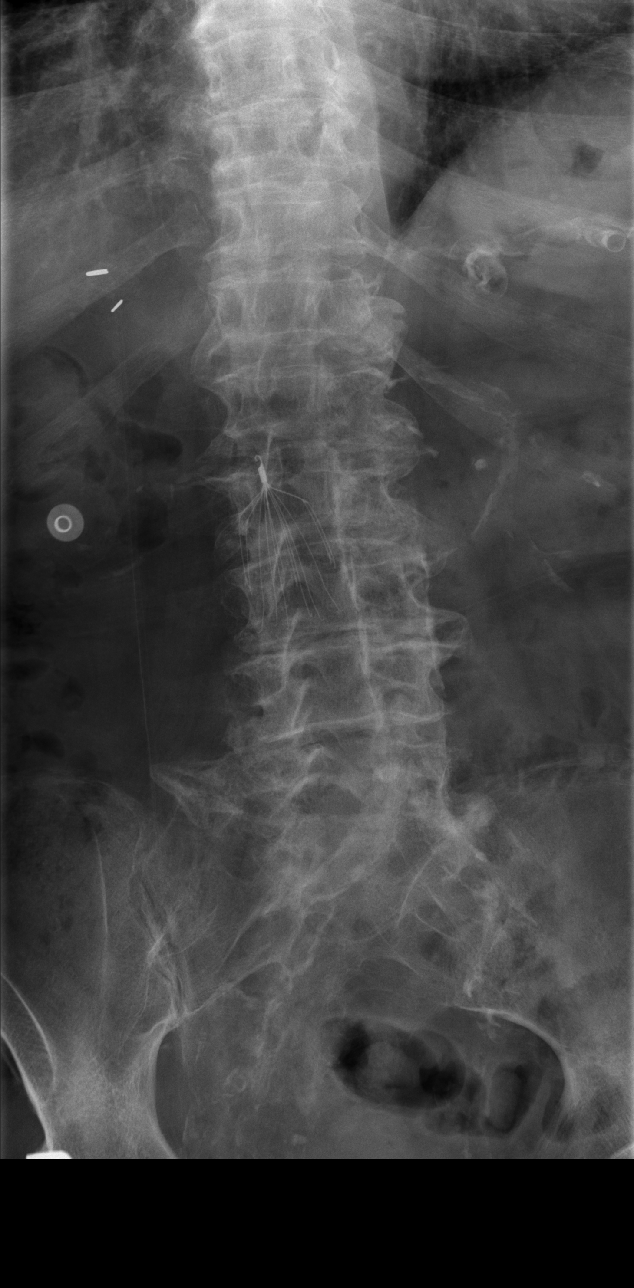
[im 4/5]
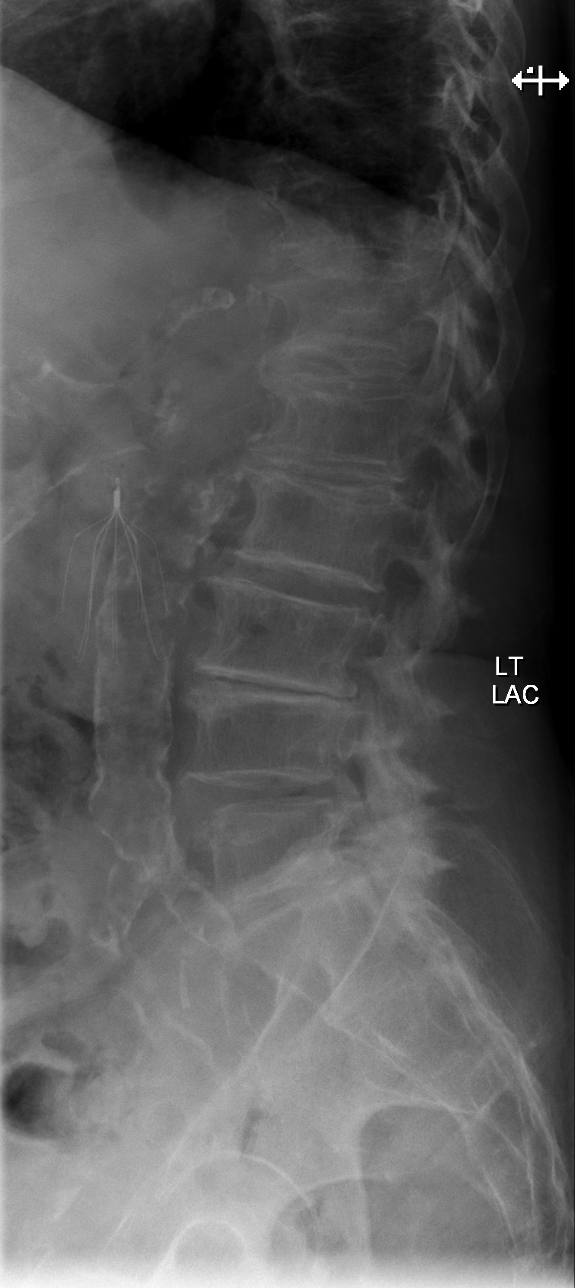
[im 5/5]
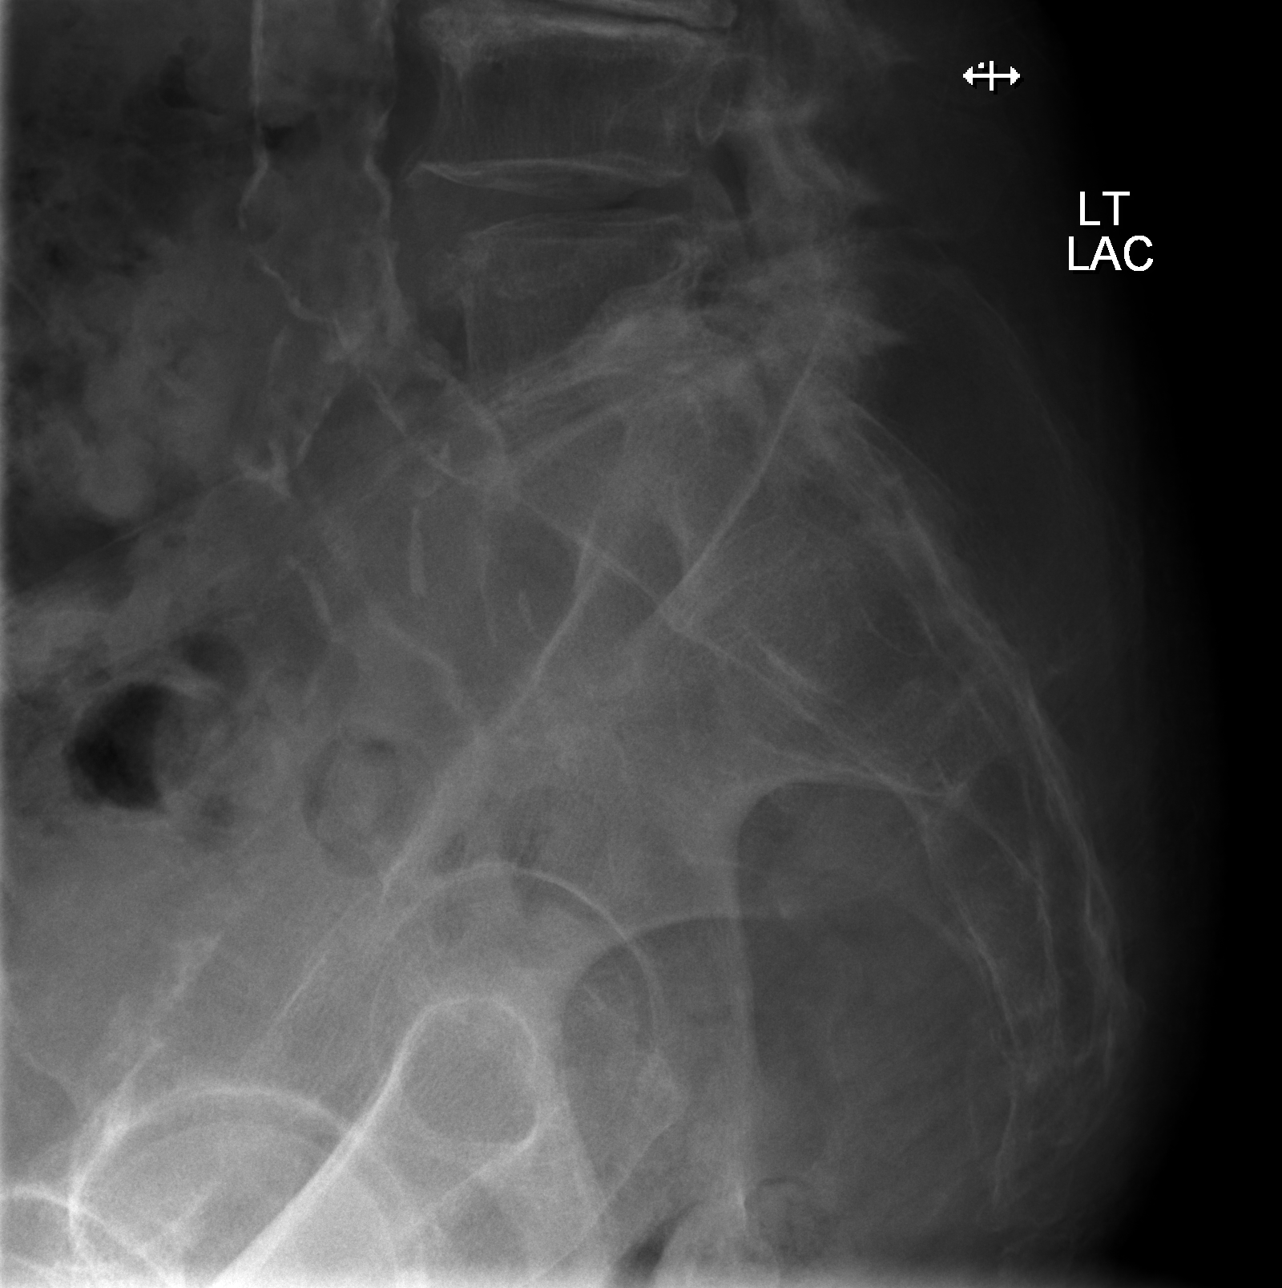

[5 of 5 positions shown; findings below may reference images not displayed]

PROCEDURE:     DXR - DXR LUMBAR SPINE WITH OBLIQUES  - August 11, 2012  [DATE]

RESULT:     Inferior vena caval filter noted. Degenerative changes lumbar
spine and both hips. Mild T12, L1 compression fractures noted. These are
unchanged, the lumbar spine is unchanged from 11/20/2008. Phleboliths.
Diffuse degenerative changes noted. No acute abnormality noted.
IMPRESSION: No acute abnormality.

## 2015-03-12 NOTE — Consult Note (Signed)
PATIENT NAME:  Christine Gonzales, SCHENK MR#:  [id number] DATE OF BIRTH:  10/11/1925  DATE OF CONSULTATION:  07/04/2012  REFERRING PHYSICIAN:   CONSULTING PHYSICIAN:  Muhammad A. Fletcher Anon, MD  PRIMARY CARE PHYSICIAN: Deborra Medina, MD   PRIMARY CARDIOLOGIST: Dr. Rockey Situ    REASON FOR CONSULTATION: Elevated cardiac enzymes.   HISTORY OF PRESENT ILLNESS: This is an 81 year old female with known history of chronic systolic heart failure and coronary artery disease. She presented in April of 2013 with non-ST elevation myocardial infarction. Cardiac catheterization showed significant three vessel coronary artery disease. Her ejection fraction was 20 to 25%. She was transferred to [facility] and was evaluated by Dr. Roxy Manns who felt that the patient was not a candidate for coronary artery bypass graft surgery. She was treated medically. During that hospitalization, she had anemia that required a blood transfusion and fluid overload that responded to furosemide. She was on aspirin and Plavix at that time. In June she had recurrent falls leading to significant physical injuries with large scalp hematoma that required surgical evacuation. Her Plavix was discontinued. She was noted to be slightly bradycardic with a heart rate in the 50's at that time while she was on Coreg 12.5 mg twice daily. Coreg was discontinued at that time as it was felt that it might have been contributing to her falls. She has been staying at [facility] for rehab. She presented with increased shortness of breath over the last five days associated with epigastric discomfort and swelling in the lower extremities. She had CT angiogram of the chest which showed small pulmonary embolus within the right anterior base. Lower extremity Doppler showed left lower extremity DVT in the SFA vein. Her cardiac enzymes were mildly elevated.   PAST MEDICAL HISTORY:  1. Chronic systolic heart failure with ejection fraction of 20 to 25%.  2. Coronary artery  disease with three vessel CAD. Not a candidate for CABG.  3. Head scalp hematoma due to fall in June of this year.  4. Mild dementia.  5. Anemia.  6. Chronic kidney disease.  7. History of breast cancer.  8. Hypertension.  9. Diabetes.  10. Peripheral vascular disease.  11. Hypothyroidism.  12. Carotid artery disease.   SOCIAL HISTORY: Negative for smoking, alcohol, or recreational drug use. The patient is staying at [facility].   FAMILY HISTORY: Negative for premature coronary artery disease.   ALLERGIES: No known drug allergies.   HOME MEDICATIONS:  1. Acetaminophen/hydrocodone as needed.  2. Anusol suppositories as needed.  3. Aspirin 325 mg once daily.  4. Atorvastatin 60 mg at bedtime.  5. Colace 100 mg twice daily.  6. Aricept 10 mg at bedtime.  7. Letrozole 2.5 mg daily.  8. Levothyroxine 125 mcg once a day.  9. Lexapro 10 mg once daily.  10. Losartan 100 mg once daily.  11. Nystatin.  12. Prilosec 20 mg daily.  13. Spironolactone 12.5 mg once a day.  14. Tylenol as needed.  15. Vitamin D3.   REVIEW OF SYSTEMS: A 10 point review of systems was performed. It is negative other than what is mentioned in the history of present illness.   PHYSICAL EXAMINATION:   VITAL SIGNS: Temperature 98.2, pulse 73, respiratory rate 20, blood pressure 166/76, oxygen saturation is 94% on 1.5 liters of oxygen.   HEENT: Normocephalic, atraumatic.   NECK: No JVD or carotid bruits.   RESPIRATORY: Normal respiratory effort with no use of accessory muscles. Auscultation reveals few bibasilar crackles.   CARDIOVASCULAR:  Normal PMI. Normal S1 and S2 with no gallops or murmurs.   ABDOMEN: Benign. There is mild tenderness in the epigastric area. Normal bowel sounds.   EXTREMITIES: No clubbing or cyanosis. There is +1 edema bilaterally worse on the left side with chronic stasis dermatitis.   PSYCHIATRIC: She seems to be alert and oriented x3 and is very pleasant.   LABORATORY AND  DIAGNOSTIC DATA: Her initial troponin was 0.07 which increased to 0.13. CPK was 32. Labs showed a creatinine of 1.21. BNP was 32,000. Hemoglobin was 9.9.   ECG showed normal sinus rhythm with ST and T wave changes in the lateral leads.   IMPRESSION:  1. Acute on chronic systolic heart failure.  2. Mildly elevated cardiac enzymes likely due to supply/demand ischemia. Underlying small non-ST elevation myocardial infarction cannot be excluded.  3. Left lower extremity DVT with small pulmonary embolism.  4. Known history of severe three vessel coronary artery disease currently being treated medically.  5. Mild dementia.   RECOMMENDATIONS: The patient appears to be fluid overloaded with decompensation of heart failure. She was also found to have left lower extremity DVT with small pulmonary embolism. The patient had significant bleeding complications while she was on Plavix. Thus, I do not think she is a good candidate for long-term anticoagulation with warfarin. There are plans about placing an IVC filter which I think is reasonable.   Regarding her cardiac status, I recommend optimizing her heart failure medications. She used to be on high dose Coreg but that was discontinued due to bradycardia and concerns about falls. For now, her blood pressure is elevated, I will add small dose long-acting nitroglycerin as well as amlodipine. If her blood pressure remains elevated in spite of that, I will consider adding very small dose of carvedilol. I agree with IV furosemide as she appears to be fluid overloaded. No further ischemic cardiac evaluation is recommended.   We will continue to follow the patient with you.   ____________________________ Mertie Clause. Fletcher Anon, MD maa:drc D: 07/05/2012 08:15:12 ET T: 07/05/2012 11:11:50 ET JOB#: [id number]  cc: Muhammad A. Fletcher Anon, MD, <Dictator> Wellington Hampshire MD ELECTRONICALLY SIGNED 07/05/2012 12:12

## 2015-03-12 NOTE — Consult Note (Signed)
Patient with LLE DVT and PE.  Can not be anti-coagulated at this time due to recent major bleed.  Agree with placement of IVC filter and this will be done first thing tomorrow am.  If anti-coagulation can be started in the near future would recommend that as well, and filter can be removed as an outpatient in several weeks/months  Electronic Signatures: Algernon Huxley (MD)  (Signed on 12-Aug-13 16:50)  Authored  Last Updated: 12-Aug-13 16:50 by Algernon Huxley (MD)

## 2015-03-12 NOTE — Consult Note (Signed)
Brief Consult Note: Diagnosis: Acute on chronic systolic heart failure, LLE DVT with ? PE, mildly elevated CEs likely supply demand ischemia.   Comments: I recommend optimizing her heart failure and hypertension.  Agree with IV Lasix as she appears mildly fluid overloaded.  No longterm anticoagulation due to recurrent falls and injuries.  I will add Amlodipine and Imdur. Coreg was stopped due to bradycardia and falls. We can consider resuming a smaller dose.  Electronic Signatures: Kathlyn Sacramento (MD)  (Signed 12-Aug-13 19:12)  Authored: Brief Consult Note   Last Updated: 12-Aug-13 19:12 by Kathlyn Sacramento (MD)

## 2015-03-12 NOTE — Op Note (Signed)
PATIENT NAME:  Christine Gonzales, Christine Gonzales MR#:  [id number] DATE OF BIRTH:  May 12, 1925  DATE OF PROCEDURE:  07/05/2012  PREOPERATIVE DIAGNOSES:  1. Deep venous thrombosis and pulmonary embolus with contraindication to anticoagulation due to recent major bleed  2. Hypertension.  3. Diabetes.   POSTOPERATIVE DIAGNOSES:  1. Deep venous thrombosis and pulmonary embolus with contraindication to anticoagulation due to recent major bleed  2. Hypertension.  3. Diabetes.   PROCEDURES:  1. Ultrasound guidance for vascular access, right femoral vein.  2. Catheter placement into inferior vena cava.  3. Inferior venacavogram.  4. Placement of a [employer] IVC filter.   SURGEON: Algernon Huxley, MD  ANESTHESIA: Local.   ESTIMATED BLOOD LOSS: Minimal.   FLUOROSCOPY TIME: Less than one minute.   CONTRAST USED: 15 mL.   INDICATION FOR PROCEDURE: 81 year old white female who is admitted with left lower extremity deep vein thrombosis and bilateral pulmonary emboli. She cannot be anticoagulated due to a recent major bleed involving a head injury and scalp laceration and we are asked to place an IVC filter. Risks and benefits are discussed. Informed consent is obtained.   DESCRIPTION OF PROCEDURE: Patient is brought to the vascular interventional radiology suite. Groins shaved and prepped, sterile surgical field was created. Ultrasound is used to visualize a patent femoral vein on the right. This is accessed under direct ultrasound guidance without difficulty with a Seldinger needle. J-wire was placed. After skin nick and dilatation the delivery sheath was placed into the inferior vena cava and inferior venacavogram was performed. This showed a patent vena cava with the level of the renal veins at the L1-L2 interspace and I deployed the filter at the top of L2. The delivery sheath was removed. Pressure was held. Sterile dressing is placed. The patient tolerated procedure well.   ____________________________ Algernon Huxley, MD jsd:cms D: 07/05/2012 07:58:58 ET T: 07/05/2012 11:00:26 ET JOB#: [id number]  cc: Algernon Huxley, MD, <Dictator> Algernon Huxley MD ELECTRONICALLY SIGNED 07/07/2012 16:44

## 2015-03-12 NOTE — Discharge Summary (Signed)
PATIENT NAME:  Christine Gonzales, Christine Gonzales MR#:  [id number] DATE OF BIRTH:  Mar 31, 1925  DATE OF ADMISSION:  07/04/2012 DATE OF DISCHARGE:  07/07/2012  DISCHARGE DIAGNOSES:  1. Possible pneumonia, on antibiotics, improving. 2. Bilateral pulmonary emboli, very small in size. No need of anticoagulation per pulmonary considering recent head/scalp hematoma and bleeding history.  3. Acute on chronic left-sided heart failure, systolic in nature.   4. Acute on chronic systolic heart failure with ejection fraction 25% on Lasix, Imdur and now aspirin. on angiotensin receptor blocker.  5. Anemia, iron deficiency, on supplement.  6. New onset deep vein thrombosis in the left lower extremity status post inferior vena cava filter. 7. Elevated cardiac enzymes, likely due to supply demand ischemia.   SECONDARY DIAGNOSES:  1. Dementia. 2. History of anemia. 3. Breast cancer.  4. Hypertension. 5. Diabetes.  6. Peripheral vascular disease.  7. Hypothyroidism.  8. History of myocardial infarction and congestive heart failure with ejection fraction of less than 25%.   CONSULTATIONS:  1. Vascular surgery, Dr. Lucky Cowboy. 2. Cardiology, Dr. Fletcher Anon.  3. Palliative care, Dr. Ermalinda Memos.  4. Pulmonary, Dr. Raul Del.   5. Physical therapy.   PROCEDURES/RADIOLOGY: IVC filter placement by Dr. Lucky Cowboy on 08/13. Chest x-ray on of 08/12 showed cardiomegaly with mild interstitial edema and trace to small pleural effusion consistent with congestive heart failure. CT scan of the chest with PE protocol on 08/12 showed bilateral pulmonary embolism. Also, pleural effusion. Patchy ground-glass areas of attenuation, likely secondary to infectious/inflammatory process or edema. Lower extremity Doppler's bilaterally on 08/12 showed diffuse nonocclusive thrombus in one superficial femoral vein on the left. No evidence of deep venous thrombosis on the right.   MAJOR LABORATORY PANEL: Urinalysis on admission was showing trace bacteria, 10 WBCs, 1+ leukocyte  esterase. Blood cultures x2 were negative.   HISTORY AND SHORT HOSPITAL COURSE: The patient is an 81 year old female with the above-mentioned medical problems who was admitted for acute on chronic systolic heart failure along with bilateral PE and possible pneumonia. She was started on IV Levaquin. She could not be anticoagulated due to recent history of head/scalp hematoma and bilateral ultrasound Doppler performed which showed deep vein thrombosis on the left side for which vascular surgery was consulted who placed IVC filter. She had borderline elevated cardiac enzymes which was thought to be due to supply demand ischemia. She was slowly improving on current regimen and cardiology consultation was obtained who agreed with the above management. The patient was evaluated by physical therapy who recommended short-term rehab. Palliative care consultation was also obtained who had a discussed with family and was made DO NOT RESUSCITATE. The patient was stable enough to be discharged back to skilled nursing facility on 08/15 and is being discharged.    PHYSICAL EXAMINATION: VITAL SIGNS: On the date of discharge her vital signs were as follows: Temperature 98.6, heart rate 61 per minute, respirations 18 per minute, blood pressure 109/60 mmHg. She was saturating 96% on room air. CARDIOVASCULAR: S1, S2 normal. No murmurs, rubs or gallops. LUNGS: Clear to auscultation bilaterally. No wheezing, rales, rhonchi, or crepitation. ABDOMEN: Soft, benign. NEUROLOGIC: Nonfocal examination. All other physical examination remained at the baseline.   DISCHARGE MEDICATIONS:  1. Cetaphil topical once daily at bedtime.  2. Levothyroxine 125 mcg p.o. daily.  3. Acetaminophen/hydrocodone 500/5, one tablet p.o. every six hours as needed.  4. Spironolactone 25 mg half tablet p.o. daily.  5. Anusol-HC one suppository rectal every 24 hours as needed. 6. Aspirin 325 mg p.o. daily.  7. Lipitor 20 mg 3 tablets p.o. at bedtime.   8. Tylenol 650 mg p.o. every six hours as needed.  9. Colace 100 mg p.o. b.i.d.  10. Prilosec OTC 20 mg p.o. daily.  11. Letrozole 2.5 mg p.o. daily.  12. Lexapro 10 mg p.o. daily.  13. Vitamin D3 1000 international units once daily.  14. Donepezil 10 mg p.o. at bedtime.  15. Losartan 100 mg p.o. daily.  16. Nystatin topical twice a day.  17. Levaquin 250 mg p.o. daily for three days.  18. Lasix 20 mg p.o. daily.   DISCHARGE DIET: Low sodium.   DISCHARGE ACTIVITY: As tolerated.   DISCHARGE INSTRUCTION AND FOLLOWUP:  1. The patient was instructed to follow up with her primary physician, Dr. Margarita Rana, in 1 to 2 weeks.  2. She will follow up with cardiology, Dr. Rockey Situ, in two to four weeks and Dr. Lucky Cowboy in four to six weeks from vascular surgery.  3. She will get physical therapy evaluation and management while at the facility.   TIME SPENT ON DISCHARGE: 55 minutes.   ____________________________ Lucina Mellow. Manuella Ghazi, MD vss:ap D: 07/07/2012 16:15:48 ET T: 07/07/2012 17:40:21 ET JOB#: [id number]  cc: Hanya Guerin S. Manuella Ghazi, MD, <Dictator> Jerrell Belfast, MD Minna Merritts, MD Algernon Huxley, MD Efraim Kaufmann, MD  Lucina Mellow [facility] MD ELECTRONICALLY SIGNED 07/10/2012 23:39

## 2015-03-12 NOTE — Consult Note (Signed)
PATIENT NAME:  Christine Gonzales, GAO MR#:  [id number] DATE OF BIRTH:  Jun 26, 1925  DATE OF CONSULTATION:  07/04/2012  CONSULTING PHYSICIAN:  Algernon Huxley, MD  REASON FOR CONSULTATION: Deep vein thrombosis with contraindication to anticoagulation.   HISTORY OF PRESENT ILLNESS: The patient is an 81 year old white female who was admitted with shortness of breath for five days. She is found to have congestive heart failure with a markedly reduced ejection fraction. She has a significant left lower extremity deep vein thrombosis. She had a recent trauma from a fall with a head injury and scalp laceration, and at this time she is not felt to be a safe candidate for anticoagulation, so we are consulted for evaluation for IVC filter placement.   PAST MEDICAL/SURGICAL HISTORY: Significant for: 1. Recent myocardial infarction, congestive heart failure and ejection fraction of 25%.  2. Head scalp hematoma due to fall.  3. Dementia.  4. Urinary tract infection.  5. Anemia.  6. Renal insufficiency.  7. Breast cancer.  8. Hypertension.  9. Diabetes.  10. Cholecystectomy. 11. Hysterectomy.   SOCIAL HISTORY: No alcohol or tobacco abuse.   FAMILY HISTORY: Mother had bladder cancer. Both parents have hypertension and heart disease.   ALLERGIES: None.   HOME MEDICATIONS:    1. Vicodin as needed for pain.  2. Anusol suppository as needed.  3. Aspirin 325 mg daily.  4. Atorvastatin 20 mg daily.  5. Cetaphil topical cream.  6. Colace 100 mg b.i.d.  7. Donepezil 10 mg at bedtime.  8. Letrozole 2.5 mg daily.  9. Levothyroxine 125 mcg daily.  10. Lexapro 10 mg daily.  11. Losartan 100 mg daily.  12. Nystatin topical b.i.d.  13. Prilosec 20 mg daily.  14. Spironolactone 12.5 mg daily.  15. Tylenol 325 mg, 2 tablets every 6 hours as needed for pain.  16. Vitamin D 1000 units daily.   REVIEW OF SYSTEMS: GENERAL: No fevers or chills. No intentional weight loss or gain. EYES: No blurred or double vision.  EARS: No tinnitus or ear pain. CARDIAC: Recent myocardial infarction. RESPIRATORY: Positive for shortness of breath and cough. GASTROINTESTINAL: Positive for nausea and poor appetite. GENITOURINARY: No dysuria or hematuria. ENDOCRINE: No heat or cold intolerance. PSYCHIATRIC: Positive for dementia. NEUROLOGIC: No transient ischemic attack, stroke, or seizure. HEMATOLOGIC: Recent bleeding from scalp wound. SKIN: No new rash or ulcers.   PHYSICAL EXAMINATION:  GENERAL: This is a frail elderly white female lying in bed, not in apparent distress.   VITAL SIGNS: Temperature 98.5, pulse 76, blood pressure is 184/70.   HEENT: Head: Normocephalic and atraumatic. Eyes: Sclerae are anicteric. Conjunctivae are clear. Ears: Normal external appearance. Hearing is diminished.   NECK: Supple without adenopathy or jugular venous distention.   HEART: Irregular with murmur.   LUNGS: Diminished with some rhonchi bilaterally.   ABDOMEN: Soft, nondistended, nontender.   EXTREMITIES: Mild lower extremity edema on the right, moderate lower extremity edema on the left.   NEUROLOGICAL: No focal deficits in any extremity appreciable.   PSYCHIATRIC: Poor historian.   SKIN: Warm and dry.   LABORATORY, DIAGNOSTIC AND RADIOLOGICAL DATA: B-type natriuretic peptide of 32,557. Sodium is 143, potassium 3.7, chloride 109, CO2 26, BUN 14, creatinine 1.21, glucose is 162. White blood cell count is 10.5, hemoglobin is 9.9, platelet count is 261,000.   ASSESSMENT AND PLAN: The patient is an 81 year old white female who has contraindication to anticoagulation and a new diagnosis of deep venous thrombosis. For this reason, an IVC filter is indicated.  The risks and benefits were discussed with the family and the patient, and they agree with proceeding. We will plan an IVC filter placement first thing tomorrow morning.   This is a level-4 consultation.   ____________________________ Algernon Huxley, MD jsd:cbb D: 07/13/2012  13:35:39 ET T: 07/13/2012 15:08:52 ET JOB#: [id number]  cc: Algernon Huxley, MD, <Dictator> Algernon Huxley MD ELECTRONICALLY SIGNED 07/27/2012 11:19

## 2015-03-12 NOTE — H&P (Signed)
PATIENT NAME:  Christine Gonzales, Christine Gonzales MR#:  [id number] DATE OF BIRTH:  February 17, 1925  DATE OF ADMISSION:  07/04/2012  PRIMARY CARE PHYSICIAN: Dr. Venia Minks  REFERRING PHYSICIAN: Dr. Michel Santee    CHIEF COMPLAINT: Shortness of breath for five days.   HISTORY OF PRESENT ILLNESS: 81 year old Caucasian female with history of myocardial infarction, coronary artery disease, congestive heart failure with ejection fraction less than 25%, recent head scalp hematoma due to fall about six weeks ago, history of hypertension, diabetes presented to ED with shortness of breath for five days. In addition, patient had cough without sputum but she denies any fever, chills. No headache or dizziness. No chest pain, palpitation, orthopnea, nocturnal dyspnea but has leg edema. Patient was admitted in April due to heart attack and congestive heart failure. Echo showed ejection fraction less than 25%, multiple vessel stenosis so patient was sent to [facility] for coronary artery bypass graft, however, according to patient, patient did not receive any surgery. She was discharged with medicine. She has been treated with aspirin. In ED she got a CT angiogram which showed small pulmonary embolus within right anterior base segmental pulmonary artery and within left lateral pulmonary artery. According to Dr. Michel Santee, ED physician, she said per radiologist the pulmonary embolus is very, very tiny and not significant clinically but CAT scan also showed patchy ground glass opacities throughout much of the lung. Suspect pneumonia so she admitted patient for pneumonia and pleural effusion.  PAST MEDICAL HISTORY: 1. As mentioned above recent myocardial infarction with congestive heart failure, ejection fraction less than 25%. 2. Head scalp hematoma due to fall. 3. History of dementia.  4. Urinary tract infection. 5. Anemia. 6. Renal insufficiency. 7. Breast cancer. 8. Hypertension. 9. Diabetes. 10. Peripheral vascular disease. 11. Hypothyroidism.    PAST SURGICAL HISTORY:  1. Cholecystectomy.  2. Hysterectomy.   SOCIAL HISTORY: Denies any smoking, alcohol drinking or illicit drugs   FAMILY HISTORY: Mother had bladder cancer. Cardiovascular disease in both parents and also hypertension.   ALLERGIES: No.   MEDICATIONS:  1. Acetaminophen hydrocodone 500 mg/5 mg p.o. q.6h. p.r.n.  2. Anusol HC 25 mg rectal suppository q.24 hours p.r.n.  3. Aspirin 325 mg p.o. daily. 4. Atorvastatin 20 mg 3 tablets at bedtime.  5. Cetaphil topical cream apply to legs topically once a day at 8:00 p.m. as discussed.  6. Colace 100 mg p.o. b.i.d.  7. Donepezil 10 mg p.o. at bedtime.  8. Letrozole 2.5 mg p.o. daily.  9. Levothyroxine 125 mcg p.o. daily.  10. Lexapro 10 mg p.o. daily.  11. Losartan 100 mg p.o. daily.  12. Nystatin 100,000 units topical b.i.d.  13. Prilosec 20 mg p.o. daily.  14. Spironolactone 25 mg 0.5 tablet p.o. daily.  15. Tylenol 325 mg 2 tablets q.6 hours p.r.n.   16. Vitamin D3 1000 units p.o. daily.   REVIEW OF SYSTEMS: CONSTITUTIONAL: Patient denies any fever, chills. No headache or dizziness but has weakness and weight loss. EYES: No double vision, blurred vision. ENT: No epistaxis, postnasal drip or slurred speech or dysphagia. RESPIRATORY: Positive for shortness of breath, cough without sputum. No hemoptysis. No wheezing. CARDIOVASCULAR: No chest pain, palpitations, orthopnea or nocturnal dyspnea but has leg edema. GASTROINTESTINAL: Positive for nausea and vomiting once. No abdominal pain, diarrhea. No melena or bloody stool. GENITOURINARY: No dysuria, hematuria, or incontinence. SKIN: No rash or jaundice. HEMATOLOGY: No easy bruising, bleeding. NEUROLOGIC: No syncope, loss of consciousness or seizure. PSYCH: No depression but has dementia.    PHYSICAL  EXAMINATION:  VITALS: Blood pressure 98/48, pulse 70, respirations 20, oxygen saturation 98% on oxygen by nasal cannula.   GENERAL: Patient is alert, awake, oriented in no  acute distress.   HEENT: Pupils round, equal, reactive to light, accommodation.   NECK: Supple. No JVD or carotid bruit. No lymphadenopathy. No thyromegaly.   CARDIOVASCULAR: S1, S2 regular rate, rhythm. No murmurs, gallops.   PULMONARY: Bilateral air entry. Bilateral mild basilar rales. No wheezing. No crackles. No use of accessory muscles to breathe.  ABDOMEN: Soft. No distention or tenderness. No organomegaly. Bowel sounds present.   EXTREMITIES: Trace edema. No clubbing or cyanosis but has mild erythema in the lower part of her extremities but according to the patient is chronic. No tenderness. Strong bilateral pedal pulses.   NEUROLOGY: Alert and oriented x3. No focal deficit. Power 5/5. Sensation intact. Deep tendon reflexes 2+.   SKIN: No rash or jaundice.    LABORATORY, DIAGNOSTIC AND RADIOLOGICAL DATA: WBC 10.5, hemoglobin 9.9, platelets 261, glucose 162, BUN 14, creatinine 1.21. Electrolytes are normal. Troponin 0.07, CK 32, CK-MB 2.0. Urinalysis negative. EKG showed normal sinus rhythm at 72 beats per minute. CT angiogram chest showed small pulmonary embolus as mentioned in history of present illness. Moderate right effusion, small left effusion with probable dependent atelectasis, patchy ground glass opacifications throughout much of the lung parenchyma, component of interstitial edema, infection and interstitial spread of malignancy not excluded. Mild to moderate bilateral hilar and mediastinal adenopathy.   IMPRESSION:  1. Possible pneumonia.  2. Small bilateral pulmonary embolus.   3. Bilateral pleural effusions.  4. Possible congestive heart failure decompensation, acute on chronic systolic dysfunction.  5. Coronary artery disease.  6. Cerebrovascular accident.  7. Breast cancer.  8. Hypertension.  9. Diabetes.  10. Anemia.   PLAN OF TREATMENT:  1. Patient will be admitted to telemetry floor.  2. Will continue O2 by nasal cannula, telemonitor. Will start Levaquin and  follow-up blood culture, sputum culture.  3. Will start strict input and output, daily weights. Hold Aldactone, losartan. No Lasix due to low blood pressure but follow-up BNP. May start Lasix and resume losartan if blood pressure is more than 100.  4. For small pulmonary embolus we will get lower extremity duplex but no anticoagulation due to recent head/scalp hematoma. According to radiologist pulmonary embolus is very small, tiny and not significant.  5. For coronary artery disease, will continue aspirin and statin. 6. For diabetes, will start sliding scale and follow-up hemoglobin A1c.  7. CODE STATUS: DO NOT RESUSCITATE.   Discussed the patient's situation and plan of treatment with patient.   TIME SPENT: About 65 minutes.   ____________________________ Demetrios Loll, MD qc:cms D: 07/04/2012 07:00:32 ET T: 07/04/2012 09:14:10 ET JOB#: [id number]  cc: Demetrios Loll, MD, <Dictator> Jerrell Belfast, MD Demetrios Loll MD ELECTRONICALLY SIGNED 07/05/2012 15:27

## 2015-03-17 NOTE — Discharge Summary (Signed)
PATIENT NAME:  THOMAS, RHUDE MR#:  [id number] DATE OF BIRTH:  04-23-1925  DATE OF ADMISSION:  02/23/2012 DATE OF DISCHARGE:  02/24/2012  REASON FOR TRANSFER: Patient requiring coronary artery bypass graft.   ACCEPTING PHYSICIAN: Dr. Addison Lank at [place]:  1. Severe multivessel coronary disease requiring coronary artery bypass graft.  2. Non-ST elevation myocardial infarction likely due to underlying coronary artery disease.  3. Acute heart failure, systolic and diastolic.  4. Malignant hypertension.  5. Renal insufficiency.  6. Chronic anemia.  7. Diabetes mellitus.  8. Leukocytosis.  9. Possible acute bronchitis.   SECONDARY DIAGNOSES:  1. History of dementia.  2. Delirium. 3. Urinary tract infection.  4. Iron deficiency anemia.  5. Chronic renal insufficiency.  6. History of breast carcinoma. 7. Diabetes mellitus.  8. Hypothyroidism.  9. Peripheral vascular disease.  10. Cardiomyopathy.   CONSULTATION: Cardiology, Dr. Rockey Situ.   LABORATORY, DIAGNOSTIC AND RADIOLOGICAL DATA: Cardiac catheterization on 04/03 showed severe left main LAD, left circumflex and RCA disease.   Chest x-ray on 04/02 showed findings consistent with congestive heart failure. No focal pneumonia.   Bilateral lower extremity Doppler's on 04/03 showed no evidence of deep vein thrombosis.   2-D echocardiogram on 04/03 showed mildly dilated left ventricle, ejection fraction less than 25%, severely reduced LV systolic function. Impaired LV relaxation. Severe global hypokinesis of the left ventricle. Basal to mid inferior wall best preserved. Normal LV systolic function. Mildly dilated left atrium. Mild mitral regurgitation. Mild tricuspid regurgitation. Elevated RV systolic pressure at 30 to 40 mmHg.   Urinalysis on admission showed 6 WBCs, 1+ bacteria, trace leukocyte esterase. Blood cultures x2 were negative.   HISTORY AND SHORT HOSPITAL COURSE: Patient is an  81 year old female with above mentioned medical problems was admitted for shortness of breath, cough, palpitations and some EKG changes, was ruled in for non-ST elevation myocardial infarction. She was also thought to have possible congestive heart failure. Please see Dr. Keenan Bachelor dictated history and physical for further details. Cardiology consult was obtained with Dr. Rockey Situ who recommended cardiac catheterization along with 2-D echo which were performed with results dictated above. Catheterization showing severe multivessel disease requiring transfer to tertiary care center. Patient chose [facility] where she is being transferred. Her accepting physician is Dr. Addison Lank at [place] THIS PATIENT: 45 minutes.  ____________________________ Lucina Mellow. Manuella Ghazi, MD vss:cms D: 02/24/2012 22:52:45 ET T: 02/25/2012 11:43:39 ET JOB#: [id number]  cc: Shalina Norfolk S. Manuella Ghazi, MD, <Dictator> Deborra Medina, MD Minna Merritts, MD Loretha Brasil. Lia Foyer, MD Remer Macho MD ELECTRONICALLY SIGNED 02/25/2012 21:52

## 2015-03-17 NOTE — Consult Note (Signed)
General Aspect 81 year old Caucasian female with history of diabetes mellitus, iron deficiency anemia, breast cancer as well as miold dementia who came from [facility] with cough, malaise, elevated cardiac enz consistent with NSTEMI.  She reports 6 days of worsening SOB and cough. productive sputum. Family has noted labile heart rates.  She has had malaise, fever and chills. Increasing weakness with no energy.    On arrival to the Emergency Room, she was noted to have lower extremity swelling and elevated BNP. elevated troponin to >2  Her chest x-ray was concerning for possible congestive heart failure. Lasix was administered.    Present Illness . PAST MEDICAL HISTORY:  1. History of history of dementia. 2. History of delirium in the past.  3. History of urinary tract infections in the past. 4. History of admission December 2012 for hypoglycemia. 5. History of iron deficiency anemia. 6. Renal insufficiency, chronic.  7. History of breast carcinoma diagnosed in 2009. 8. History of diabetes mellitus.  9. Hypothyroidism.  10. Peripheral vascular disease. 11. Cardiomyopathy per the notes. Details not available   PAST SURGICAL HISTORY:  1. Cholecystectomy. 2. Hysterectomy.   FAMILY HISTORY: The patient's mother had bladder cancer. Cardiovascular disease in both parents, also hypertension in both parents.   SOCIAL HISTORY: She was prior smoker, however, quit multiple years ago. No alcohol abuse. Walks with a walker. Lives at [facility]   ALLERGIES: No known drug allergies.   MEDICATIONS FROM THE OAKS:  1. Acetaminophen/hydrocodone 500/5 one tablet every six hours as needed.  2. Anusol-HC 25 mg rectal suppository every 12 hours as needed.  3. Aspirin 81 mg p.o. daily.  4. Atorvastatin 40 mg p.o. daily.  5. Benzonatate 200 mg p.o. every eight hours as needed.  6. Cetaphil topical cream one application to legs at bedtime.  7. Docusate 100 mg p.o. twice daily.  8. Donepezil 10 mg p.o.  at bedtime.  9. Escitalopram 10 mg p.o. daily.  10. Furosemide 20 mg p.o. daily.  11. Glipizide 5 mg in the morning and 2.5 mg at bedtime.  12. Letrozole 2.5 mg p.o. daily.  13. Levothyroxine 125 mcg p.o. daily.  14. Losartan 100 mg p.o. daily.  15. Metoprolol tartrate 50 mg p.o. twice daily.  16. Namenda 10 mg p.o. at bedtime.  17. Nystatin topical cream to affected areas topically twice daily usually under breasts for candidiasis.  18. Omeprazole 20 mg p.o. daily.  19. Q-Tussin 5 mL every eight hours as needed.  20. Vitamin D3 1000 units once daily.   Physical Exam:   GEN WD, WN, NAD    HEENT red conjunctivae    NECK supple     RESP normal resp effort  wheezing  rhonchi     CARD Regular rate and rhythm  No murmur     ABD soft     LYMPH negative neck    EXTR negative edema    SKIN normal to palpation    NEURO cranial nerves intact, motor/sensory function intact    PSYCH alert, A+O to time, place, person, good insight   Review of Systems:   Subjective/Chief Complaint Cough, SOB, malaise, sputum    General: Weakness     Skin: No Complaints     ENT: No Complaints     Eyes: No Complaints     Neck: No Complaints     Respiratory: Frequent cough  Sputum     Cardiovascular: Dyspnea  Edema     Gastrointestinal: No Complaints  Genitourinary: No Complaints     Vascular: No Complaints     Musculoskeletal: No Complaints     Neurologic: No Complaints     Hematologic: No Complaints     Endocrine: No Complaints     Psychiatric: No Complaints     Review of Systems: All other systems were reviewed and found to be negative     Medications/Allergies Reviewed Medications/Allergies reviewed           Admit Diagnosis:   MALIGNANT HTN AMI: 24-Feb-2012, Active, MALIGNANT HTN AMI   NQWMI: Active, NQWMI      Admit Reason:   AMI (acute myocardial infarction): (410.90) Active, ICD9, Acute myocardial infarction, unspecified site, episode of care  unspecified    Cardiac:  03-Apr-13 00:04    Troponin I 1.90   CK, Total 74   CPK-MB, Serum 3.0  Routine Coag:  03-Apr-13 00:04    Activated PTT (APTT) 129.4  Cardiac:  03-Apr-13 07:19    Troponin I 2.10   CK, Total 54   CPK-MB, Serum 2.7  Routine Coag:  03-Apr-13 07:19    Activated PTT (APTT) > 160.0  Routine Hem:  03-Apr-13 07:19    WBC (CBC) 10.6   RBC (CBC) 4.34   Hemoglobin (CBC) 8.7   Hematocrit (CBC) 28.5   Platelet Count (CBC) 237   MCV 66   MCH 20.1   MCHC 30.6   RDW 19.4  Routine Chem:  03-Apr-13 07:19    Glucose, Serum 108   BUN 22   Creatinine (comp) 1.39   Sodium, Serum 144   Potassium, Serum 3.7   Chloride, Serum 104   CO2, Serum 32   Calcium (Total), Serum 8.5   Osmolality (calc) 291   eGFR (African American) 46   eGFR (Non-African American) 38   Anion Gap 8  Routine Hem:  03-Apr-13 07:19    Neutrophil % 71.2   Lymphocyte % 13.1   Monocyte % 12.8   Eosinophil % 2.0   Basophil % 0.9   Neutrophil # 7.6   Lymphocyte # 1.4   Monocyte # 1.4   Eosinophil # 0.2   Basophil # 0.1   EKG:   Interpretation EKG shows NSR with 64 with diffuse T wave ABN V2 to V5, I and AVL   Radiology Results:  XRay:    02-Apr-13 16:53, Chest Portable Single View   Chest Portable Single View    REASON FOR EXAM:    Chest Pain  COMMENTS:       PROCEDURE: DXR - DXR PORTABLE CHEST SINGLE VIEW  - Feb 23 2012  4:53PM     RESULT: Comparison is made to study of November 20, 2008.    The lungs are slightly less well inflated today. The cardiac silhouette   is enlarged. The pulmonary vascularity is engorged. The left   hemidiaphragm is somewhat indistinct. The mediastinum appears normal in   width.    IMPRESSION:  There are findings consistent with CHF. I see no focal   pneumonia. A followup PA and lateral chest x-ray would be of value when   the patient can tolerate the procedure.    Verified By: DAVID A. Martinique, M.D., MD    No Known Allergies:   Vital  Signs/Nurse's Notes:  **Vital Signs.:   03-Apr-13 07:53   Vital Signs Type Routine   Temperature Temperature (F) 97.8   Celsius 36.5   Temperature Source oral   Pulse Pulse 65   Pulse source per Dinamap  Respirations Respirations 20   Systolic BP Systolic BP 023   Diastolic BP (mmHg) Diastolic BP (mmHg) 82   Mean BP 110   BP Source Dinamap   Pulse Ox % Pulse Ox % 95   Pulse Ox Activity Level  At rest   Oxygen Delivery Room Air/ 21 %     Impression 81 year old Caucasian female with history of diabetes mellitus, iron deficiency anemia, breast cancer as well as mild dementia who came from [facility] [place] with cough, malaise, elevated cardiac enz consistent with NSTEMI.  A/P: 1) NSTEMI troponin >2 increase SOB concerning for underlyign CAD I have discussed with various treatment options with the patient and daughters They would prefer cardiac cath given her sx and severe family hx of CAD --Planned cardiac cath at [place] today (she ate breakfast this AM) --WOuld continue heaprin for now --If no significant CAD, may need chest CT to rule out PE -Echo pending  2) Cough/sputum Concerning for heart failure based on CXR Also concerning clinically for bronchitis --Consider starting ABX  3) Diabetes: Hold meds prior to cath as she is NPO Restart once eating  4)edema: She reports a long hx of edema and requiring compression boots LE ultrasound schedule to rule out DVT   Electronic Signatures: Ida Rogue (MD)  (Signed 03-Apr-13 10:02)  Authored: General Aspect/Present Illness, History and Physical Exam, Review of System, Past Medical History, Health Issues, Home Medications, Labs, EKG , Radiology, Allergies, Vital Signs/Nurse's Notes, Impression/Plan   Last Updated: 03-Apr-13 10:02 by Ida Rogue (MD)

## 2015-03-17 NOTE — Op Note (Signed)
PATIENT NAME:  Christine Gonzales, Christine Gonzales MR#:  [id number] DATE OF BIRTH:  1925/10/30  DATE OF PROCEDURE:  05/23/2012  PREOPERATIVE DIAGNOSIS: Open scalp wound.   POSTOPERATIVE DIAGNOSIS:  Open scalp wound.  PROCEDURE:  1. Wound VAC exchange under anesthesia.  2. Partial closure of complex wound.   SURGEON: Sherri Rad, M.D.   ASSISTANT: None.   ANESTHESIA: General.   DESCRIPTION OF PROCEDURE: With the patient in the lateral position and general anesthesia induced with LMA airway, the existing wound VAC was removed. The wound was irrigated. At several points the skin was tacked down to the galea with interrupted simple and vertical mattress 3-0 nylon sutures. A piece of black granular foam was cut to the size of the wound measuring approximately 6 x 6 cm in greatest dimension. Negative pressure was applied with bio-occlusive film and tract pad application. The patient was then subsequently returned supine, extubated, and taken to the recovery room in stable and satisfactory condition by anesthesia services.      ____________________________ Jeannette How Marina Gravel, MD mab:bjt D: 05/23/2012 17:58:31 ET T: 05/24/2012 10:35:11 ET JOB#: [id number]  cc: Elta Guadeloupe A. Marina Gravel, MD, <Dictator> Lew Dawes. Genevive Bi, MD

## 2015-03-17 NOTE — Op Note (Signed)
PATIENT NAME:  Christine Gonzales, Christine Gonzales MR#:  [id number] DATE OF BIRTH:  February 18, 1925  DATE OF PROCEDURE:  05/21/2012  PREOPERATIVE DIAGNOSIS: Large scalp hematoma and open wound.   POSTOPERATIVE DIAGNOSIS: Large scalp hematoma and open wound.   PROCEDURES PERFORMED:  1. Radical excisional debridement of skin and soft tissue of scalp measuring 7 x 10 cm in size. 2. Pulsed lavage irrigation. 3. Initial application of wound VAC assistive closure device.   SURGEON: Cynthea Zachman A. Marina Gravel, M.D. FACS  ANESTHESIA: MAC and 28 mL of 0.25% Marcaine with epinephrine mixed 50-50 with 1% Xylocaine.   SPECIMENS: Necrotic skin and soft tissue to pathology.   ESTIMATED BLOOD LOSS: 25 mL.   DESCRIPTION OF PROCEDURE: With the patient in the lateral decubitus position with the help of a beanbag and axillary roll, intravenous sedation was administered. A generous amount of hair was clipped with electric hair clipper around the wound. The scalp and wound were prepped and draped utilizing Betadine solution. Time out was observed.   Utilizing scalpel the full thickness necrotic skin and dermis were excised sharply and submitted to pathology. Underlying was a large cavity with undermining in a superior direction for approximately 4 to 5 cm underneath the flap of viable skin. The lip of the occipital muscle was identified and extending into the wound and was excised with electrocautery. A circumferential field block was created at this point with the above mentioned local anesthesia. Pulsed lavage irrigation with a total of 3 liters of normal saline was used to irrigate and debride the wound with hydro-jet technique. Hemostasis was obtained with point cautery and the application of topical anesthesia. A portion of the wound was closed with a 3-0 nylon encompassing bites of the inflamed galea and full thickness of the skin flap superiorly. Furthermore, medial to this, the wound edges were easily reapproximated at several points with simple  and vertical mattress 3-0 nylon sutures. With hemostasis being obtained, no other areas of skin appeared to be easily reapproximated to each other. As such a piece of black granular foam measuring greater than 50 sq/cm was applied to the wound with occlusion with Bioclusive film, track pad was applied, and negative pressure was applied without difficulty. A small amount of Mastisol was used on the wound edges to obtain a seal. The patient was then returned supine and taken to the recovery room in stable and satisfactory condition by anesthesia services.  ____________________________ Jeannette How Marina Gravel, MD mab:slb D: 05/21/2012 15:11:22 ET T: 05/21/2012 15:49:26 ET JOB#: [id number]  cc: Elta Guadeloupe A. Marina Gravel, MD, <Dictator> Jerrell Belfast, MD Lew Dawes. Genevive Bi, MD Shamere Campas Bettina Gavia MD ELECTRONICALLY SIGNED 05/26/2012 22:56

## 2015-03-17 NOTE — H&P (Signed)
PATIENT NAME:  Christine Gonzales, Christine Gonzales MR#:  [id number] DATE OF BIRTH:  08-10-1925  DATE OF ADMISSION:  02/23/2012  PRIMARY CARE PHYSICIAN: Deborra Medina, MD   HISTORY OF PRESENT ILLNESS: The patient is an 81 year old Caucasian female with history of diabetes mellitus, iron deficiency anemia, breast cancer as well as dementia who came from [facility] [place] because of increasing and decreasing heart rates as well as not feeling well. Apparently the patient had a difficult day. According to the patient's daughters, she was noted to have increasing heart rate as well as decreasing heart rate to unknown levels unfortunately but they were told that she did not feel well, her hands were freezing, however, she felt somewhat hot, feverish and chilly intermittently. She also was coughing and had no energy. When daughters arrived to the facility, they tried to get her out from facility to come to the Emergency Room. However, she was very weak and she was not able to do so. They were also hearing some noise in her lungs especially whenever she coughed. On arrival to the Emergency Room, she was noted to have lower extremity swelling and elevated BNP, found her to have congestive heart failure. She also had elevated troponin and hospitalist services were contacted for admission. Her chest x-ray was concerning for possible congestive heart failure. Lasix was administered. After Lasix the patient's lung noise is somewhat decreased and she seemed to be a little bit more comfortable according to daughters here now on the stretcher.   PAST MEDICAL HISTORY:  1. History of history of dementia. 2. History of delirium in the past.  3. History of urinary tract infections in the past. 4. History of admission December 2012 for hypoglycemia. 5. History of iron deficiency anemia. 6. Renal insufficiency, chronic.  7. History of breast carcinoma diagnosed in 2009. 8. History of diabetes mellitus.  9. Hypothyroidism.  10. Peripheral vascular  disease. 11. Cardiomyopathy. However, it is unclear what the ejection fraction or what kind of cardiomyopathy she has.    PAST SURGICAL HISTORY:  1. Cholecystectomy. 2. Hysterectomy.   FAMILY HISTORY: The patient's mother had bladder cancer. Cardiovascular disease in both parents, also hypertension in both parents.   SOCIAL HISTORY: She was prior smoker, however, quit multiple years ago. No alcohol abuse. Walks with a walker. Lives at [facility].   ALLERGIES: No known drug allergies.   MEDICATIONS FROM THE OAKS:  1. Acetaminophen/hydrocodone 500/5 one tablet every six hours as needed.  2. Anusol-HC 25 mg rectal suppository every 12 hours as needed.  3. Aspirin 81 mg p.o. daily.  4. Atorvastatin 40 mg p.o. daily.  5. Benzonatate 200 mg p.o. every eight hours as needed.  6. Cetaphil topical cream one application to legs at bedtime.  7. Docusate 100 mg p.o. twice daily.  8. Donepezil 10 mg p.o. at bedtime.  9. Escitalopram 10 mg p.o. daily.  10. Furosemide 20 mg p.o. daily.  11. Glipizide 5 mg in the morning and 2.5 mg at bedtime.  12. Letrozole 2.5 mg p.o. daily.  13. Levothyroxine 125 mcg p.o. daily.  14. Losartan 100 mg p.o. daily.  15. Metoprolol tartrate 50 mg p.o. twice daily.  16. Namenda 10 mg p.o. at bedtime.  17. Nystatin topical cream to affected areas topically twice daily usually under breasts for candidiasis.  18. Omeprazole 20 mg p.o. daily.  19. Q-Tussin 5 mL every eight hours as needed.  20. Vitamin D3 1000 units once daily.  REVIEW OF SYSTEMS: Difficult to obtain, however, because  of the patient being demented, however, she admits of feeling feverish and chilly earlier today as well as having chest pain but then she's not really sure if she had any chest pains. Also, evidence of some fatigue and weakness, had difficulty walking around, somewhat more tired today with exertion. She admits of having some blurring of vision, increased frequency of urination as well as  intermittent urinary incontinence as well as dysuria. Otherwise, denies any weight loss or gain. In regards to eyes, denies double vision, glaucoma, or cataracts. ENT: Denies any tinnitus, allergies, epistaxis, sinus pain, dentures, difficulty swallowing. RESPIRATORY: Admits of some cough. No significant sputum production. No wheezing or shortness of breath. CARDIOVASCULAR: Denies chest pains now. No edema, arrhythmias, palpitations, or syncope. GASTROINTESTINAL: Denies nausea, vomiting, diarrhea, constipation. GENITOURINARY: Denies hematuria. ENDOCRINOLOGY: Denies any polydipsia, nocturia, thyroid problems, heat or cold intolerance, or thirst. HEMATOLOGIC: Denies anemia, easy bruising, bleeding, swollen glands. SKIN: Denies acne, rashes, lesions, change in moles. MUSCULOSKELETAL: Denies arthritis, cramps, swelling. NEUROLOGIC: Denies any numbness or weakness, however upon further questioning she tells me that she has some weakness but she does not remember which side she is weak on. PSYCHIATRY: Denies anxiety or insomnia.   PHYSICAL EXAMINATION:   VITAL SIGNS: On arrival to the hospital, temperature 96.6, pulse 65, respiration rate 20, blood pressure 167/72, saturation 91% on room air.   GENERAL: This is a well developed, well nourished obese Caucasian female in no significant distress at this moment laying on the stretcher.  HEENT: Pupils are equal and reactive to light. Extraocular muscles intact. No icterus of conjunctivitis. A little bit difficulty hearing. No pharyngeal erythema. Mucosa is moist.   NECK: No masses. Supple, nontender. Thyroid not enlarged. No adenopathy. No JVD or carotid bruits bilaterally. Full range of motion.   LUNGS: No significant rales or rhonchi, somewhat clear to auscultation. Few rhonchi were heard at bases but no wheezing. No labored respirations or increased effort at this moment. No dullness to percussion, not in overt respiratory distress anteriorly, however, she does  have few rhonchi and rales.   CARDIOVASCULAR: S1, S2 appreciated. No murmurs, gallops, or rubs noted. PMI not lateralized. Distant. Chest is tender to palpation. 1+ pedal pulses. 1 to 2+ lower extremity edema bilaterally. No calf tenderness or cyanosis noted.   ABDOMEN: Soft, nontender. Bowel sounds are present. No hepatosplenomegaly or masses were noted.   RECTAL: Deferred.   MUSCULOSKELETAL: Muscle strength able to move all extremities. No cyanosis, degenerative joint disease, or kyphosis. Gait is not tested.   SKIN: Skin did not reveal any rashes, lesions, erythema, nodularity. The patient does have induration of bilateral lower extremities. Skin, otherwise, is warm and dry to palpation.   LYMPH: No adenopathy in the cervical region.   NEUROLOGICAL: Cranial nerves grossly intact. Sensory is grossly intact. No dysarthria or aphasia.   PSYCH: The patient is alert, oriented to person and place, however, not to time. She is cooperative. Her memory is significantly impaired. She is somewhat confused about what is being asked but no agitation was noted.   LABORATORY, DIAGNOSTIC, AND RADIOLOGICAL DATA: BMP showed glucose 134, BUN and creatinine 22 and 1.44 comparatively to 24 1.61 in December 2012, otherwise unremarkable BMP. The patient's liver enzymes were normal. The patient's cardiac enzymes were normal except troponin is elevated today at 1.66. White blood cell count is elevated to 11.3, hemoglobin 8.9, platelet count 303 with low MCV at 66. Coagulation panel pro-time 17.2, INR 1.4, activated PTT 40.5.   EKG sinus rhythm  with premature supraventricular complexes at 64 beats per minute, normal axis, T depressions in inferior lateral leads. No EKG to compare with. Chest x-ray revealed cardiomegaly, questionable CHF at the bases.  ASSESSMENT AND PLAN:  1. Non-Q-wave MI. Admit patient to medical floor. Continue her on beta-blockers and aspirin. Add nitroglycerin as well as heparin IV. Will check  cardiac enzymes x3. Will get cardiologist involved. Will get echocardiogram done.  2. CHF, left heart, acute. Continue patient on Lasix IV. Follow urine output, ins and outs as well as her weight. Get echocardiogram. Continue nitroglycerin. The patient's blood pressure medications should be advanced.  3. Malignant hypertension. The patient's blood pressure seemed to be poorly controlled. Will continue outpatient medications and add also Norvasc.  4. Renal insufficiency, seems to be stable, chronic. Follow with diuresis.  5. Anemia. Continue patient on iron supplements. Get guaiac.  6. Diabetes mellitus. Continue outpatient medications as well as sliding scale insulin. Get hemoglobin A1c.  7. Leukocytosis of unclear etiology. Get urinalysis to rule out urinary tract infection. The patient has been coughing for a while but no obvious pneumonia is observed on her chest x-ray.   TIME SPENT: 50 minutes.   ____________________________ Theodoro Grist, MD rv:drc D: 02/23/2012 20:24:12 ET T: 02/24/2012 06:49:24 ET JOB#: [id number]  cc: Theodoro Grist, MD, <Dictator> Deborra Medina, MD Morrison MD ELECTRONICALLY SIGNED 02/24/2012 14:26

## 2015-03-17 NOTE — Discharge Summary (Signed)
PATIENT NAME:  Christine Gonzales, BEEVER MR#:  [id number] DATE OF BIRTH:  August 09, 1925  DATE OF ADMISSION:  05/20/2012 DATE OF DISCHARGE:  05/26/2012  DISCHARGE DIAGNOSIS: Scalp wound and hematoma.   PROCEDURE: Incision and drainage with VAC application and split thickness skin grafting.   HOSPITAL COURSE: The patient was admitted from my office on the 28th of June with a large scalp hematoma. She was brought to the operating room the next day, at which point necrotic skin was excised. Pulsed lavage irrigation was performed, and the VAC was applied. On Monday, the patient went back to the Operating Room for further incision and drainage and partial closure with re-application of VAC. On Wednesday, the VAC was removed once again. I asked  Dr. Tula Nakayama to see her from Plastic Surgery as she was in the hospital and agreed with split thickness skin grafting, which was achieved on the 4th of July. The patient will be discharged with a VAC overlying the skin graft for a total of 5 days, and this will be removed Tuesday or Wednesday of next week in the office by Dr. Genevive Bi.   DISCHARGE MEDICATIONS:  1. Norco 5/325, 1 to 2 tabs every 4 hours as needed for pain. A prescription was given. 2. Tylenol as needed for pain. 3. Anusol-HC.  4. Colace 100 mg by mouth once a day.  5. Benazepril 10 mg by mouth once a day.  6. Letrozole 2.5 mg by mouth once a day.  7. Escitalopram 10 mg by mouth once a day  8. Losartan 100 mg by mouth once a day. 9. Synthroid 125 mcg by mouth once a day. 10. Namenda 10 mg by mouth once a day.  11. Spironolactone 25 mg by mouth, 1/2 tablet by mouth once a day. 12. Tylenol as needed.  13. Nystatin topical cream as needed.  14. Atorvastatin 20 mg by mouth, 3 tabs by mouth daily.  DISCHARGE INSTRUCTIONS:  1. VAC settings were 125 mmHg continuous.   2. Dressing change instructions to the donor site on the right thigh should include daily and as needed ABD changes and tape overlying the Xeroform.  Leave the Xeroform in place. Once a day during dressing changes, apply 3 minutes of a hair dryer at low setting to desiccate the wound.   ____________________________ Jeannette How Marina Gravel, MD mab:cbb D: 05/26/2012 15:28:28 ET T: 05/26/2012 15:40:58 ET JOB#: [id number]  cc: Elta Guadeloupe A. Marina Gravel, MD, <Dictator> Cortne Amara A Andrena Margerum MD ELECTRONICALLY SIGNED 05/26/2012 23:00

## 2015-03-17 NOTE — Consult Note (Signed)
General Aspect 81 year old female admitted to [facility] last month with non-ST elevation myocardial infarction, cardiac catheterization which showed severe three-vessel and left main disease. Ejection fraction was 20-25%. She was transferred to [facility] and was seen by Dr. Roxy Manns who felt that the patient was not a candidate for coronary artery bypass graft surgery. The patient was treated medically. Also with anemia and required blood transfusion, fluid overload improved with furosemide.  On her last clinic visit, she was doing well. Since then, she suffered a mechanical fall. Around that time she was having numerous falls. She presented to [facility] with a large hematoma on the back of her head. 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 Her Plavix was held, she was continued on aspirin 325 mg daily, Lipitor was increased, losartan was continued.  Since her hematoma, it has since progressed now with necrotic tissue, a split in the skin. She has seen the wound center and has referrals to local surgeons for possible debridement. She is staying at [facility]. She is working with physical therapy, still is not walking or working on her strength.  She denies any significant chest pain or shortness of breath. She has been essentially wheelchair-bound.  EKG shows sinus bradycardia with rate 69 beats per minute, nonspecific ST and T wave abnormality in lead V6, 1 and aVL    Present Illness . Social History   ???  Marital Status:  Widowed       Spouse Name:  N/A       Number of Children:  N/A   ???  Years of Education:  N/A       Occupational History   ???  Retired       Social History Main Topics   ???  Smoking status:  Former Smoker   ???  Smokeless tobacco:  Never Used   ???  Alcohol Use:  No   ???  Drug  Use:  No   ???  Sexually Active:  Not on file       Social History  ???  Lives alone in a retirement community, not sure how long ago she quit smoking but much > 1 year. Both parents lived into their 45s but had CAD and not all sibs are living but she is not aware of any CAD in any of them.   Physical Exam:   GEN well developed, well nourished, no acute distress    HEENT red conjunctivae    NECK supple  No masses    RESP normal resp effort  clear BS    CARD Regular rate and rhythm  Murmur    Murmur Systolic    ABD denies tenderness  soft    LYMPH negative neck    SKIN normal to palpation    NEURO motor/sensory function intact, Bandage over her posterior head    PSYCH alert, A+O to time, place, person   Review of Systems:   Subjective/Chief Complaint large head hematoma    General: Weakness    Skin: No Complaints    ENT: No Complaints    Eyes: No Complaints    Neck: No Complaints    Respiratory: No Complaints    Cardiovascular: No Complaints    Gastrointestinal: No Complaints    Genitourinary: No Complaints    Vascular: No Complaints  Musculoskeletal: Weak, uses a wheelchair    Neurologic: No Complaints    Hematologic: No Complaints    Endocrine: No Complaints    Psychiatric: No Complaints    Review of Systems: All other systems were reviewed and found to be negative    Medications/Allergies Reviewed Medications/Allergies reviewed     breast cancer:    Diabetes:    Renal insufficiency:    Hypothyroidism:    HTN:    Right knee replacement:    Right lumpectomy: Apr 2008  Home Medications: Medication Instructions Status  Imdur 30 mg oral tablet, extended release 1 tab(s) orally once a day (in the morning) Active  spironolactone 25 mg oral tablet 0.5 tab(s) orally once a day Active  Plavix 75 mg oral tablet 1 tab(s) orally once a day Active  Senna Lax 8.6 mg oral tablet 1 tab(s) orally once a day (at bedtime) Active  Coreg 12.5 mg  oral tablet 1 tab(s) orally 2 times a day Active  Nitrostat 0.4 mg sublingual tablet 1 tab(s) sublingual every 5 minutes, As Needed- for Chest Pain  Active  Tylenol 325 mg oral tablet 1 tab(s) orally once a day, As Needed- for Pain  Active  Cetaphil topical cream apply to legs topically once a day (at bedtime) at 8pm as directed. Active  donepezil 10 mg oral tablet 1 tab(s) orally once a day (at bedtime) at 8pm for dementia.  **brand name aricept** Active  escitalopram 10 mg oral tablet 1 tab(s) orally once a day (in the morning) at 8am.  **brand name lexapro** Active  atorvastatin 40 mg oral tablet 1 tab(s) orally once a day (at bedtime) at 8pm.  **brand name lipitor** Active  Namenda 10 mg oral tablet 1 tab(s) orally once a day (at bedtime) at 8pm. Active  furosemide 20 mg oral tablet 1 tab(s) orally once a day (in the morning) at 8am.  **brand name lasix** Active  Vitamin D3 1000 intl units oral capsule 1 cap(s) orally once a day (in the morning) at 8am. Active  losartan 100 mg oral tablet 1 tab(s) orally once a day (in the morning) at 8am. Active  levothyroxine 125 mcg (0.125 mg) oral tablet 1 tab(s) orally once a day at 6am. (check pulse weekly) Active  aspirin 81 mg oral enteric coated tablet 1 tab(s) orally once a day at 8am. Active  nystatin 100,000 units/g topical cream apply to affected areas topically 2 times a day (8am, 8pm) then pack area under breasts with dry gauze. Active  docusate sodium 100 mg oral capsule 1 cap(s) orally 2 times a day (8am, 8pm) Active  Anusol-HC 25 mg rectal suppository 1 suppository(ies) rectal every 12 hours as needed for hemmorhoids Active  benzonatate 200 mg oral capsule 1 cap(s) orally every 8 hours as needed for cough. Active  Q-Tussin 100 mg/5 mL oral liquid 1 teaspoonful (5 milliliters) orally every 8 hours as needed for cough. Active  acetaminophen-hydrocodone 500 mg-5 mg oral tablet 1 tab(s) orally every 6 hours as needed for pain or cough. Active   letrozole 2.5 mg oral tablet 1 tab(s) orally once a day at 8am.  **brand name femara** Active  omeprazole 20 mg oral delayed release capsule 1 cap(s) orally once a day (in the morning) at 6:30am. Active    No Known Allergies:   Vital Signs/Nurse's Notes: **Vital Signs.:   28-Jun-13 16:08   Vital Signs Type Admission   Temperature Temperature (F) 98   Temperature Source oral   Pulse Pulse 64  Pulse source per vital sign device   Respirations Respirations 18   Systolic BP Systolic BP 384   Diastolic BP (mmHg) Diastolic BP (mmHg) 76   Mean BP 98   BP Source vital sign device   Pulse Ox % Pulse Ox % 100   Pulse Ox Activity Level  At rest   Oxygen Delivery Room Air/ 21 %     Impression 81 year old female admitted to [facility] last month with non-ST elevation myocardial infarction, cardiac catheterization which showed severe three-vessel and left main disease. Ejection fraction was 20-25%. She was transferred to [facility] and was seen by Dr. Roxy Manns who felt that the patient was not a candidate for coronary artery bypass graft surgery. The patient was treated medically. Also with anemia and required blood transfusion, fluid overload improved with furosemide. Recent mechanical fall with large posterior head hematoma Also with recent stroke etiology not clear    Plan 1) preoperative evaluation High risk for surgery though it would depend on anesthesia She would not be a good candidate for general anesthesia given her extensive coronary artery disease Certainly if surgery is needed and she is aware of the risks and benefits, we could try medical management through this perioperative period  2)CAD (coronary artery disease) -  Currently with no symptoms of angina. No further workup at this time. Continue current medication regimen. Not a candidate for bypass.     3) Carotid artery occlusion -  Recent stroke, possibly embolic from carotid. Continue aggressive medical management.  High-dose aspirin and statin. 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 Hemoglobin A1c typically in the low 7 range  5) Hypertension -  Blood pressure is well controlled  We will not restart beta blocker as it is uncertain if bradycardia was contributing to her falls last month. Currently heart rate is adequate.  6) Systolic CHF, acute -  Clinically, does not appear to have heart failure. Again we will not start some of her medications given frequent falls last month leading to hematoma. We'll hold off on restarting beta blockers.   Electronic Signatures: Ida Rogue (MD)  (Signed 28-Jun-13 17:09)  Authored: General Aspect/Present Illness, History and Physical Exam, Review of System, Past Medical History, Home Medications, Allergies, Vital Signs/Nurse's Notes, Impression/Plan   Last Updated: 28-Jun-13 17:09 by Ida Rogue (MD)

## 2015-03-17 NOTE — Consult Note (Signed)
PATIENT NAME:  Christine Gonzales, Christine Gonzales MR#:  [id number] DATE OF BIRTH:  06/06/1925  DATE OF CONSULTATION:  05/25/2012  REFERRING PHYSICIAN:  Elta Guadeloupe A. Marina Gravel, MD CONSULTING PHYSICIAN:  Cleda Daub, MD  REASON FOR CONSULTATION: Open wound, occiput.   ADMISSION DIAGNOSES: Scalp wound and hematoma.  HOSPITAL COURSE: The patient was admitted by Dr. Marina Gravel on 06/28 with a scalp hematoma. This was surgically debrided and managed with Wound VAC and moist dressing. I have been asked to evaluate the wound for closure possibility and recommendations for wound care.  The patient is an 81 year old female who has a complicated medical history. However, she has been improving and has had the Wound VAC for several days. The Wound VAC is removed, and the wound bed is examined. The dimensions of the wound are approximately 6 x 9 cm on the posterior occiput. It is granulating well, and the periosteum is intact throughout. The wound edges are clean. There is no evidence of infection. There is good vascularity.   Dr. Marina Gravel has asked me for my opinion with regard to management of this wound, and his surgical plan is for skin graft. I think this is an excellent option, and we have discussed the use of a Wound VAC bolster to facilitate take of the skin graft.  The patient is in good condition and otherwise I expect good healing with the skin graft. The family did ask me the alternatives which included primary healing of the open wound, and I recommended avoiding this as it would be many months to heal. All other questions were answered, and Dr. Marina Gravel plans for skin graft the following day.   ____________________________ Cleda Daub, MD bsc:kma D: 05/27/2012 10:19:00 ET T: 05/27/2012 11:23:21 ET JOB#: [id number]  cc: Elta Guadeloupe A. Marina Gravel, MD Cleda Daub MD ELECTRONICALLY SIGNED 06/03/2012 10:46

## 2015-03-17 NOTE — Op Note (Signed)
PATIENT NAME:  Christine Gonzales, Christine Gonzales MR#:  [id number] DATE OF BIRTH:  [birth year]  DATE OF PROCEDURE:  05/26/2012  PREOPERATIVE DIAGNOSIS: Open scalp wound measuring 36 sq cm.   POSTOPERATIVE DIAGNOSIS: Open scalp wound measuring 36 sq cm.    PROCEDURE PERFORMED: Split thickness skin graft to right posterior scalp from right leg.   SURGEON: Sherri Rad, M.D.   ASSISTANT: None.   ANESTHESIA: General with LMA.   DESCRIPTION OF PROCEDURE: With the patient in the lateral position with beanbag and appropriate padding, the existing dressing was removed. The wound was irrigated and debrided a small amount of the granulation tissue with the back of a forceps clamp. The right leg was prepped and draped with Betadine solution. A total of 120 mL of normal saline containing epinephrine was infiltrated in a subdermal manner. Utilizing the Zimmer dermatome set at 16/1000 of an inch, a 2-inch wide swath measuring 7 cm was harvested from the right leg, placed on the meshing apparatus and meshed in a 1:1.5 ratio. It was then applied dermal side down to the scalp wound and secured at multiple points with 4-0 chromic suture. Several existing nylon sutures needed to be removed as they would have ended up underneath the mesh. A piece of Adaptic was placed with a 1 cm overlap. A piece of black granular foam was applied. Skin edges were prepared with benzoin and Ioban draping was used to attach the foam to the wound. Track pad was applied, placed to a canister of VAC suction and adequate seal was obtained. The patient was then subsequently returned supine, extubated, and taken to the recovery room in stable and satisfactory condition by anesthesia.   ____________________________ Jeannette How. Marina Gravel, MD mab:ap D: 05/26/2012 11:34:13 ET            T: 05/27/2012 10:41:27 ET                 JOB#: [id number] cc: Elta Guadeloupe A. Marina Gravel, MD, <Dictator> Jerrell Belfast, MD Lew Dawes. Genevive Bi, MD Elta Guadeloupe Bettina Gavia MD ELECTRONICALLY SIGNED 05/27/2012 19:59

## 2015-03-17 NOTE — Op Note (Signed)
PATIENT NAME:  Christine Gonzales, Christine Gonzales MR#:  [id number] DATE OF BIRTH:  04-20-1925  DATE OF PROCEDURE:  05/23/2012  PREOPERATIVE DIAGNOSIS: Open scalp wound.   POSTOPERATIVE DIAGNOSIS:  Open scalp wound.  PROCEDURE:  1. Wound VAC exchange under anesthesia.  2. Partial closure of complex wound.   SURGEON: Sherri Rad, M.D.   ANESTHESIA: General.   DESCRIPTION OF PROCEDURE: With the patient in the lateral position and general anesthesia induced with LMA airway, the existing wound VAC was removed. The wound was irrigated. At several points the skin was tacked down to the galea with interrupted simple and vertical mattress 3-0 nylon sutures. A piece of black granular foam was cut to the size of the wound measuring approximately 6 x 6 cm in greatest dimension. Negative pressure was applied with bio-occlusive film and tract pad application. The patient was then subsequently returned supine, extubated, and taken to the recovery room in stable and satisfactory condition by anesthesia services.      ____________________________ Jeannette How Marina Gravel, MD mab:bjt D: 05/23/2012 17:58:00 ET T: 05/24/2012 10:35:11 ET JOB#: [id number]  cc: Christia Reading E. Genevive Bi, MD Elta Guadeloupe A. Marina Gravel, MD, <Dictator>  Delisha Peaden A Szymon Foiles MD ELECTRONICALLY SIGNED 05/26/2012 22:58
